# Patient Record
Sex: Female | Born: 1965
Health system: Southern US, Community
[De-identification: ages and names within clinical notes are randomized; demographics above are authoritative.]

## PROBLEM LIST (undated history)

## (undated) DIAGNOSIS — E785 Hyperlipidemia, unspecified: Secondary | ICD-10-CM

## (undated) DIAGNOSIS — T7840XA Allergy, unspecified, initial encounter: Secondary | ICD-10-CM

## (undated) DIAGNOSIS — Z889 Allergy status to unspecified drugs, medicaments and biological substances status: Secondary | ICD-10-CM

## (undated) HISTORY — DX: Allergy status to unspecified drugs, medicaments and biological substances: Z88.9

## (undated) HISTORY — PX: OTHER SURGICAL HISTORY: SHX169

## (undated) HISTORY — DX: Hyperlipidemia, unspecified: E78.5

## (undated) HISTORY — DX: Allergy, unspecified, initial encounter: T78.40XA

---

## 2008-04-05 ENCOUNTER — Ambulatory Visit (HOSPITAL_COMMUNITY): Admission: RE | Admit: 2008-04-05 | Discharge: 2008-04-05 | Payer: Self-pay | Admitting: Occupational Medicine

## 2008-07-01 ENCOUNTER — Telehealth (INDEPENDENT_AMBULATORY_CARE_PROVIDER_SITE_OTHER): Payer: Self-pay | Admitting: *Deleted

## 2008-07-08 ENCOUNTER — Ambulatory Visit: Payer: Self-pay | Admitting: Internal Medicine

## 2008-07-08 LAB — CONVERTED CEMR LAB
Albumin: 4 g/dL (ref 3.5–5.2)
BUN: 13 mg/dL (ref 6–23)
Basophils Absolute: 0 10*3/uL (ref 0.0–0.1)
Chloride: 103 meq/L (ref 96–112)
Cholesterol: 275 mg/dL — ABNORMAL HIGH (ref 0–200)
Eosinophils Absolute: 0.2 10*3/uL (ref 0.0–0.7)
GFR calc non Af Amer: 115.9 mL/min (ref >60)
HCT: 35.2 % — ABNORMAL LOW (ref 36.0–46.0)
Hemoglobin: 12 g/dL (ref 12.0–15.0)
Leukocytes, UA: NEGATIVE
Lymphocytes Relative: 36.6 % (ref 12.0–46.0)
Lymphs Abs: 1.7 10*3/uL (ref 0.7–4.0)
MCHC: 34 g/dL (ref 30.0–36.0)
Neutro Abs: 2.3 10*3/uL (ref 1.4–7.7)
Nitrite: NEGATIVE
Platelets: 295 10*3/uL (ref 150.0–400.0)
Potassium: 3.8 meq/L (ref 3.5–5.1)
RDW: 12.7 % (ref 11.5–14.6)
Specific Gravity, Urine: 1.015 (ref 1.000–1.030)
TSH: 0.74 microintl units/mL (ref 0.35–5.50)
Total Bilirubin: 0.8 mg/dL (ref 0.3–1.2)
Total CHOL/HDL Ratio: 5
Triglycerides: 65 mg/dL (ref 0.0–149.0)
Urobilinogen, UA: 0.2 (ref 0.0–1.0)
VLDL: 13 mg/dL (ref 0.0–40.0)
pH: 6.5 (ref 5.0–8.0)

## 2008-07-15 ENCOUNTER — Ambulatory Visit: Payer: Self-pay | Admitting: Internal Medicine

## 2008-07-15 ENCOUNTER — Encounter: Payer: Self-pay | Admitting: Internal Medicine

## 2008-07-15 ENCOUNTER — Other Ambulatory Visit: Admission: RE | Admit: 2008-07-15 | Discharge: 2008-07-15 | Payer: Self-pay | Admitting: Internal Medicine

## 2008-07-15 DIAGNOSIS — A159 Respiratory tuberculosis unspecified: Secondary | ICD-10-CM | POA: Insufficient documentation

## 2008-07-15 DIAGNOSIS — J301 Allergic rhinitis due to pollen: Secondary | ICD-10-CM

## 2008-07-15 DIAGNOSIS — E785 Hyperlipidemia, unspecified: Secondary | ICD-10-CM

## 2008-07-15 DIAGNOSIS — N760 Acute vaginitis: Secondary | ICD-10-CM | POA: Insufficient documentation

## 2008-07-15 DIAGNOSIS — H9319 Tinnitus, unspecified ear: Secondary | ICD-10-CM | POA: Insufficient documentation

## 2008-07-25 ENCOUNTER — Encounter: Payer: Self-pay | Admitting: Internal Medicine

## 2009-01-10 ENCOUNTER — Ambulatory Visit: Payer: Self-pay | Admitting: Internal Medicine

## 2009-01-10 LAB — CONVERTED CEMR LAB
Direct LDL: 210.8 mg/dL
Total CHOL/HDL Ratio: 5
VLDL: 11.6 mg/dL (ref 0.0–40.0)

## 2009-01-13 ENCOUNTER — Telehealth: Payer: Self-pay | Admitting: Internal Medicine

## 2009-02-03 ENCOUNTER — Ambulatory Visit: Payer: Self-pay | Admitting: Internal Medicine

## 2009-03-22 ENCOUNTER — Ambulatory Visit: Payer: Self-pay | Admitting: Internal Medicine

## 2009-03-22 LAB — CONVERTED CEMR LAB
ALT: 18 units/L (ref 0–35)
AST: 19 units/L (ref 0–37)
Albumin: 3.9 g/dL (ref 3.5–5.2)
Alkaline Phosphatase: 50 units/L (ref 39–117)
Cholesterol: 181 mg/dL (ref 0–200)
Total CHOL/HDL Ratio: 3
Triglycerides: 40 mg/dL (ref 0.0–149.0)

## 2009-03-29 ENCOUNTER — Encounter: Payer: Self-pay | Admitting: Internal Medicine

## 2009-11-27 ENCOUNTER — Encounter: Payer: Self-pay | Admitting: Internal Medicine

## 2010-01-02 ENCOUNTER — Telehealth: Payer: Self-pay | Admitting: Internal Medicine

## 2010-01-06 ENCOUNTER — Ambulatory Visit: Payer: Self-pay | Admitting: Internal Medicine

## 2010-01-06 LAB — CONVERTED CEMR LAB
AST: 21 units/L (ref 0–37)
Albumin: 4.3 g/dL (ref 3.5–5.2)

## 2010-01-08 ENCOUNTER — Encounter: Payer: Self-pay | Admitting: Internal Medicine

## 2010-03-01 NOTE — Progress Notes (Signed)
Summary: LABS  Phone Note Call from Patient Call back at [phone]   Summary of Call: Pt left vm that she has office visit 01/06/10. She wants to know if she needs labs prior?  Initial call taken by: Lamar Sprinkles, CMA,  January 02, 2010 5:22 PM  Follow-up for Phone Call        had lipid and metabolic panel 03/22/09 and these were normal. No need for any screening lab. Follow-up by: Jacques Navy MD,  January 02, 2010 5:32 PM  Additional Follow-up for Phone Call Additional follow up Details #1::        Left vm for pt that nothing was needed prior to apt.  Additional Follow-up by: Lamar Sprinkles, CMA,  January 02, 2010 5:40 PM

## 2010-03-01 NOTE — Letter (Signed)
   [facility] [address] [place], [place]  [address] Phone: [phone]      March 29, 2009   Lindsey Boyd [address] [place], [place] [address]  RE:  LAB RESULTS  Dear  Ms. HORSEY,  The following is an interpretation of your most recent lab tests.  Please take note of any instructions provided or changes to medications that have resulted from your lab work.  LIVER FUNCTION TESTS:  Good - no changes needed  LIPID PANEL:  Good - no changes needed Triglyceride: 40.0   Cholesterol: 181   LDL: 107   HDL: 66.00   Chol/HDL%:  3    Excellent response with a drop in LDL from 210 to 107. Please continue medication.    Sincerely Yours,    Jacques Navy MD

## 2010-03-01 NOTE — Assessment & Plan Note (Signed)
Summary: PH NOTE APPT -D/T----STC--pt rs'd/cd   Vital Signs:  Patient profile:   45 year old female Height:      60 inches Weight:      108 pounds BMI:     21.17 O2 Sat:      97 % on Room air Temp:     97.5 degrees F oral Pulse rate:   74 / minute BP sitting:   104 / 68  (left arm) Cuff size:   regular  Vitals Entered By: Bill Salinas CMA (February 03, 2009 3:46 PM)  O2 Flow:  Room air CC: pt here for office visit to discuss high lipid levels, appt to discuss medical therapy/ ab   Primary Care Provider:  [facility]  CC:  pt here for office visit to discuss high lipid levels and appt to discuss medical therapy/ ab.  History of Present Illness: patient presents to review lab: LDL 210!! HDL is good.  Current Medications (verified): 1)  None  Allergies (verified): No Known Drug Allergies PMH-FH-SH reviewed-no changes except otherwise noted  Review of Systems  The patient denies anorexia, fever, weight loss, weight gain, vision loss, decreased hearing, hoarseness, chest pain, syncope, dyspnea on exertion, peripheral edema, prolonged cough, headaches, hemoptysis, abdominal pain, melena, hematochezia, severe indigestion/heartburn, hematuria, incontinence, genital sores, muscle weakness, suspicious skin lesions, transient blindness, difficulty walking, depression, unusual weight change, abnormal bleeding, enlarged lymph nodes, angioedema, and breast masses.    Physical Exam  General:  Well-developed,well-nourished,in no acute distress; alert,appropriate and cooperative throughout examination Head:  Normocephalic and atraumatic without obvious abnormalities. No apparent alopecia or balding. Eyes:  corneas and lenses clear and no injection.   Lungs:  normal respiratory effort.   Heart:  normal rate and regular rhythm.   Neurologic:  alert & oriented X3 and gait normal.   Skin:  turgor normal and color normal.   Psych:  Oriented X3, memory intact for recent and remote, normally  interactive, and good eye contact.     Impression & Recommendations:  Problem # 1:  OTHER AND UNSPECIFIED HYPERLIPIDEMIA (ICD-272.4) Extremely high LDL despite life-style mgt. Discussed metabolic derrangment and need for life-time therapy. Discussed medication: mechanism of action and side effects.   Plan - start simvastatin 20mg  once daily          f/u lab in 1 month.  Her updated medication list for this problem includes:    Simvastatin 20 Mg Tabs (Simvastatin) .Marland Kitchen... 1 by mouth once daily for cholesterol management  (20 min face-to-face counseling and education)  Complete Medication List: 1)  Simvastatin 20 Mg Tabs (Simvastatin) .Marland Kitchen.. 1 by mouth once daily for cholesterol management 2)  Cromolyn Sodium 4 % Soln (Cromolyn sodium) .... 2 qtts od 4 times a day until irritation resolves. (allergic conjunctivitis) Prescriptions: CROMOLYN SODIUM 4 % SOLN (CROMOLYN SODIUM) 2 qtts OD 4 times a day until irritation resolves. (allergic conjunctivitis)  #10cc x 0   Entered and Authorized by:   Jacques Navy MD   Signed by:   Jacques Navy MD on 02/04/2009   Method used:   Electronically to        [facility] [phone]* (retail)       [address]       [place], [place]  [address]       Ph: [phone] or [phone]       Fax: [phone]   RxID:   [id number] SIMVASTATIN 20 MG TABS (SIMVASTATIN) 1 by mouth once daily  for cholesterol management  #30 x 12   Entered and Authorized by:   Jacques Navy MD   Signed by:   Jacques Navy MD on 02/04/2009   Method used:   Electronically to        [facility] [phone]* (retail)       [address]       [place], [place]  [address]       Ph: [phone] or [phone]       Fax: [phone]   RxID:   [id number]

## 2010-03-02 NOTE — Letter (Signed)
   [facility] [address] [place], [place]  [address] Phone: [phone]      January 09, 2010   GENOVEVA Henningsen [address] [place], [place] [address]  RE:  LAB RESULTS  Dear  Ms. WIATREK,  The following is an interpretation of your most recent lab tests.  Please take note of any instructions provided or changes to medications that have resulted from your lab work.  LIVER FUNCTION TESTS:  Good - no changes needed    Thanks for sending health screening labs - looking good. Liver functions are normal.  Plan - continue present medications.   Sincerely Yours,    Jacques Navy MD  Patient: Lindsey Boyd Note: All result statuses are Final unless otherwise noted.  Tests: (1) Hepatic/Liver Function Panel (HEPATIC)   Total Bilirubin           0.7 mg/dL                   2.9-5.6   Direct Bilirubin          0.1 mg/dL                   2.1-3.0   Alkaline Phosphatase      51 U/L                      39-117   AST                       21 U/L                      0-37   ALT                       17 U/L                      0-35   Total Protein             7.6 g/dL                    8.6-5.7   Albumin                   4.3 g/dL                    8.4-6.9

## 2010-03-02 NOTE — Assessment & Plan Note (Signed)
Summary: OV---STC   Vital Signs:  Patient profile:   45 year old female Height:      60 inches Weight:      112 pounds BMI:     21.95 O2 Sat:      98 % on Room air Temp:     97.4 degrees F oral Pulse rate:   73 / minute BP sitting:   98 / 72  (left arm) Cuff size:   regular  Vitals Entered By: Bill Salinas CMA (January 06, 2010 10:45 AM)  O2 Flow:  Room air  Primary Care Illiana Losurdo:  [facility]   History of Present Illness: Patient returns for follow-up of hyperlipidemia. She has restarted statin therapy. She did have lipid profile as part of [facility] insurance screening and will obtain results. Last lab in Feb '11 with good results.. She feels well and has no medication side effects.  Current Medications (verified): 1)  Simvastatin 20 Mg Tabs (Simvastatin) .Marland Kitchen.. 1 By Mouth Once Daily For Cholesterol Management  Allergies (verified): No Known Drug Allergies PMH-FH-SH reviewed-no changes except otherwise noted  Review of Systems  The patient denies anorexia, fever, weight loss, chest pain, dyspnea on exertion, abdominal pain, muscle weakness, difficulty walking, and enlarged lymph nodes.    Physical Exam  General:  Well-developed,well-nourished,in no acute distress; alert,appropriate and cooperative throughout examination Eyes:  C&S clear Lungs:  normal respiratory effort.   Heart:  normal rate and regular rhythm.     Impression & Recommendations:  Problem # 1:  OTHER AND UNSPECIFIED HYPERLIPIDEMIA (ICD-272.4) Will obtain health screening labs. Will send for hepatic profile.  Her updated medication list for this problem includes:    Simvastatin 20 Mg Tabs (Simvastatin) .Marland Kitchen... 1 by mouth once daily for cholesterol management  Orders: TLB-Hepatic/Liver Function Pnl (80076-HEPATIC)  addendum liver functions normal. Lipid from screening normal range.  Problem # 2:  ALLERGIC RHINITIS, SEASONAL (ICD-477.0) provided refill Rxs.  Complete Medication List: 1)  Simvastatin 20 Mg  Tabs (Simvastatin) .Marland Kitchen.. 1 by mouth once daily for cholesterol management 2)  Cromolyn Sodium 4 % Soln (Cromolyn sodium) .... 2 qttds ou qid 3)  Nasonex 50 Mcg/act Susp (Mometasone furoate) .... 2 sprays per nostril once daily Prescriptions: SIMVASTATIN 20 MG TABS (SIMVASTATIN) 1 by mouth once daily for cholesterol management  #30 x 12   Entered and Authorized by:   Jacques Navy MD   Signed by:   Jacques Navy MD on 01/06/2010   Method used:   Print then Give to Patient   RxID:   [id number] NASONEX 50 MCG/ACT SUSP (MOMETASONE FUROATE) 2 sprays per nostril once daily  #1 bottle x 5   Entered and Authorized by:   Jacques Navy MD   Signed by:   Jacques Navy MD on 01/06/2010   Method used:   Print then Give to Patient   RxID:   [id number] CROMOLYN SODIUM 4 % SOLN (CROMOLYN SODIUM) 2 qttds ou qid  #10 cc x 3   Entered and Authorized by:   Jacques Navy MD   Signed by:   Jacques Navy MD on 01/06/2010   Method used:   Print then Give to Patient   RxID:   [id number]    Orders Added: 1)  TLB-Hepatic/Liver Function Pnl [80076-HEPATIC] 2)  Est. Patient Level II [03474]

## 2010-03-28 IMAGING — CR DG CHEST 2V
2 series · 2 of 2 positions shown · non-contrast
Comparison: None

CLINICAL DATA: Positive PPD.  Asymptomatic.

CHEST - 2 VIEW

[w chest pa]
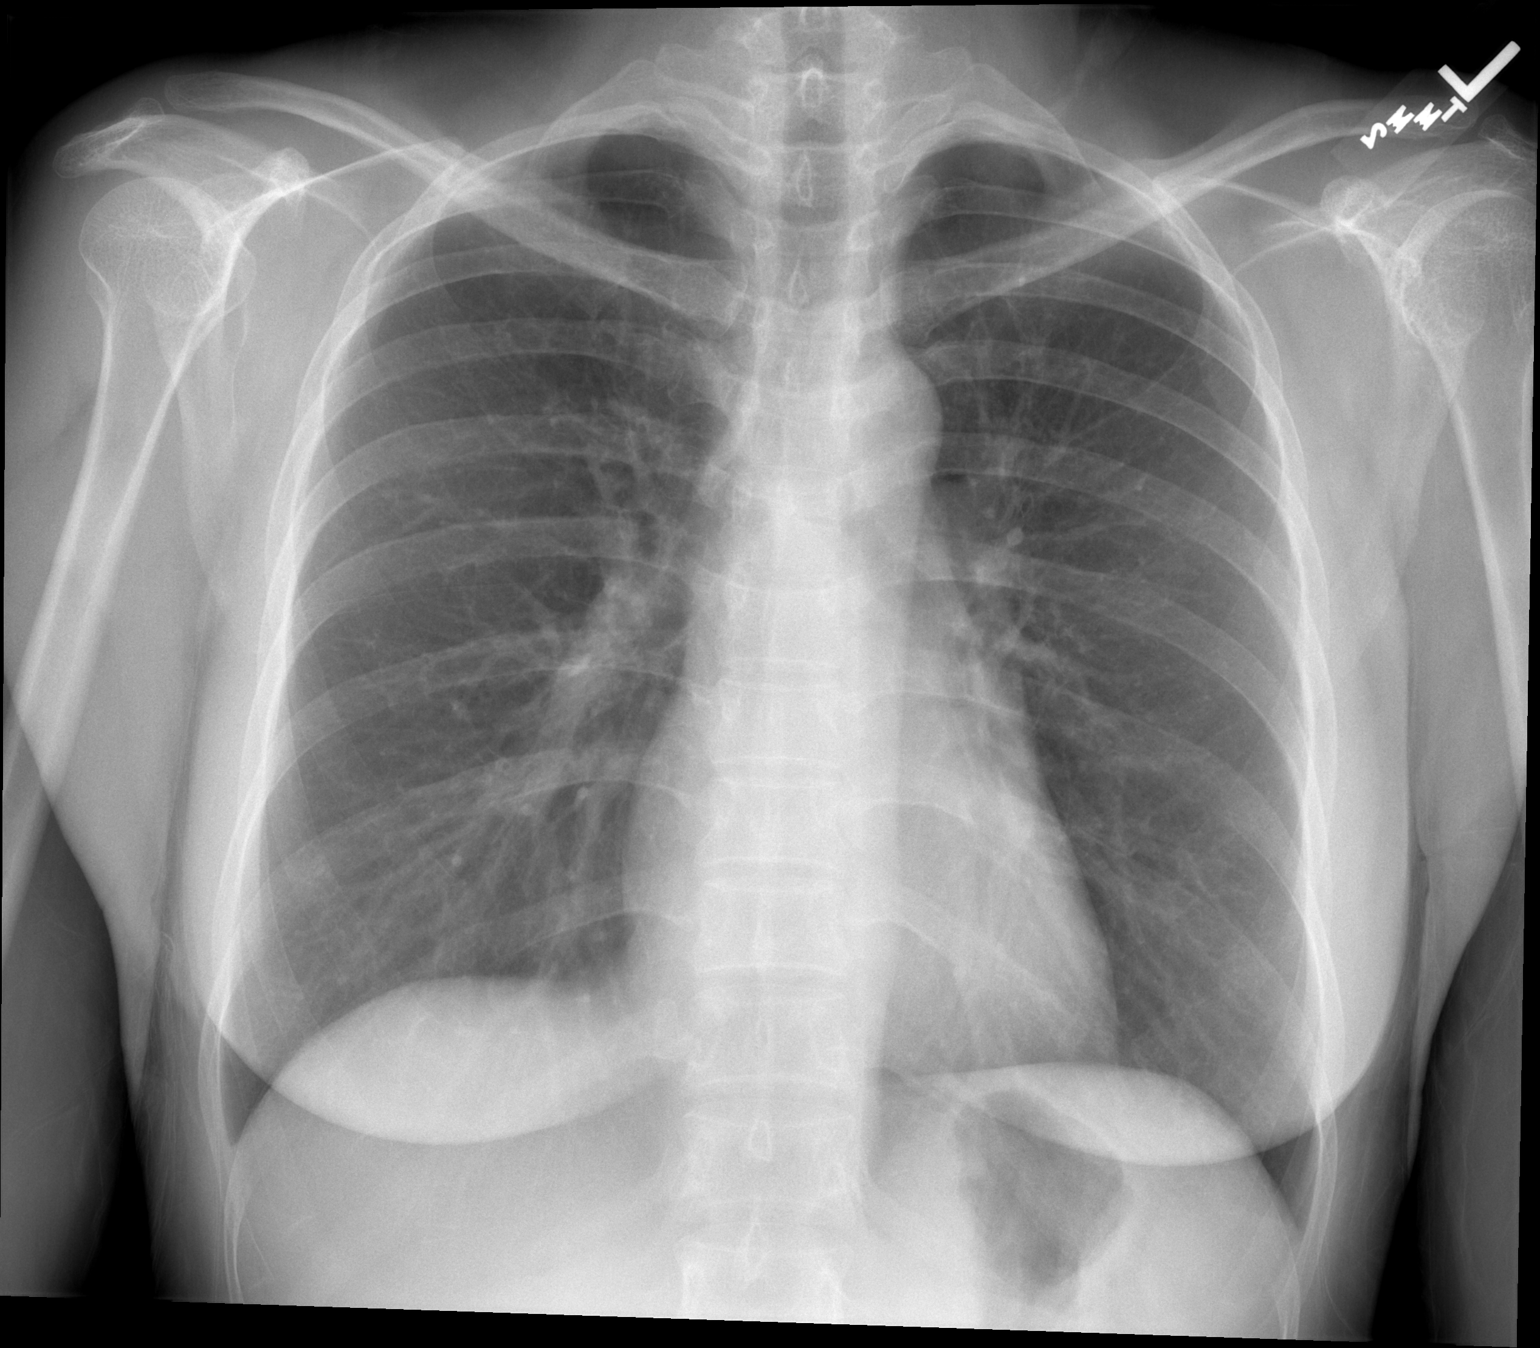

[w chest lat]
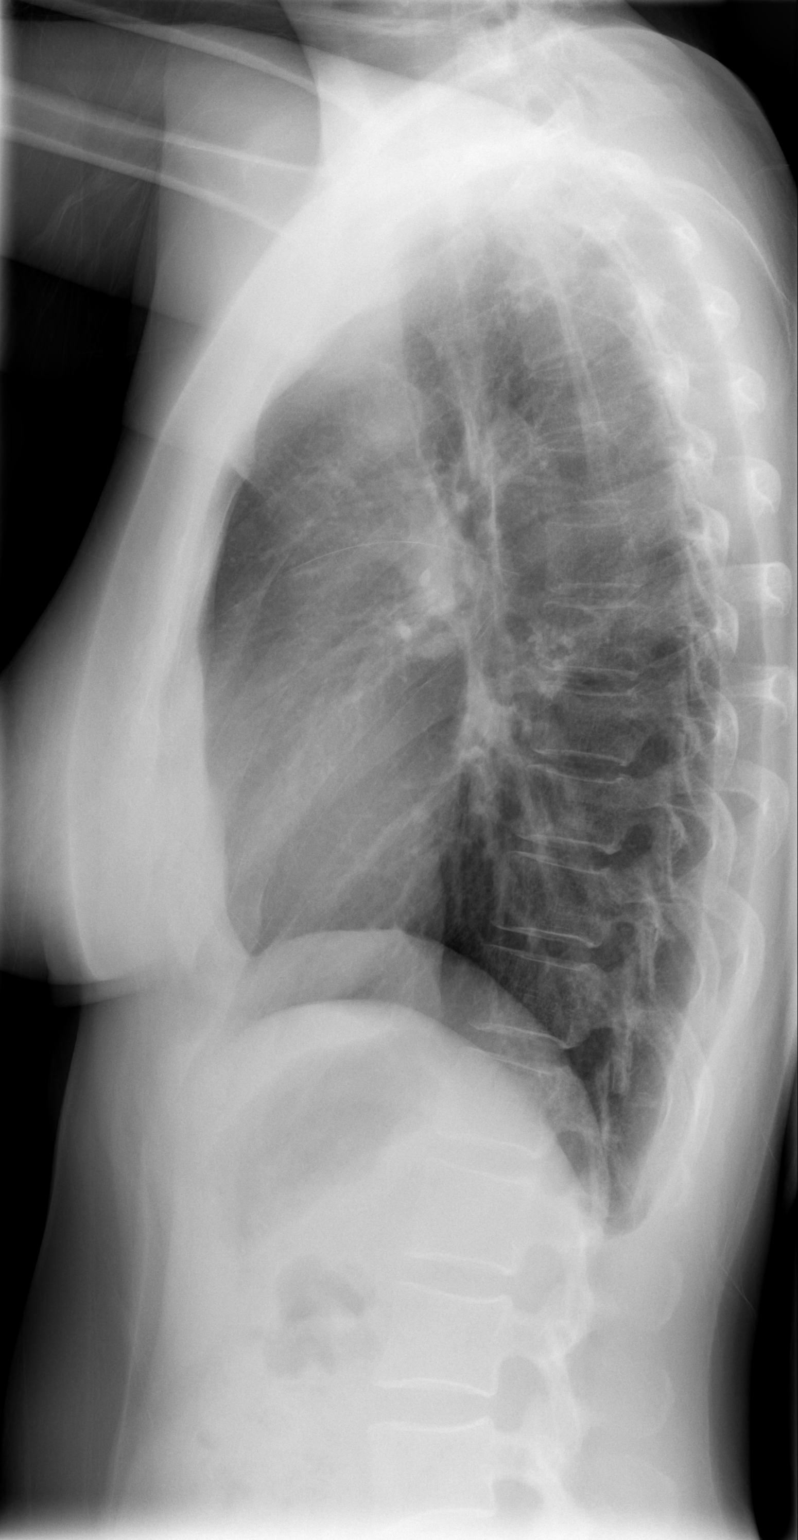

[2 of 2 positions shown; findings below may reference images not displayed]

FINDINGS: Heart size is normal.  The mediastinum is unremarkable.
No effusions.  I think there may be wonder to small calcified
granulomas in the superior segment of the left lower lobe.  No sign
of active infection.  No sign of mediastinal nodes.  Bony
structures unremarkable.
IMPRESSION: Suspicion of a few small granulomas in the superior segment of the
left lower lobe.  No sign of active pulmonary infection.

## 2011-01-09 ENCOUNTER — Other Ambulatory Visit: Payer: Self-pay | Admitting: Internal Medicine

## 2011-04-10 ENCOUNTER — Encounter: Payer: Self-pay | Admitting: Internal Medicine

## 2011-04-10 ENCOUNTER — Ambulatory Visit (INDEPENDENT_AMBULATORY_CARE_PROVIDER_SITE_OTHER): Payer: 59 | Admitting: Internal Medicine

## 2011-04-10 ENCOUNTER — Other Ambulatory Visit (INDEPENDENT_AMBULATORY_CARE_PROVIDER_SITE_OTHER): Payer: 59

## 2011-04-10 VITALS — BP 96/64 | HR 93 | Temp 98.0°F | Resp 14 | Wt 113.1 lb

## 2011-04-10 DIAGNOSIS — E785 Hyperlipidemia, unspecified: Secondary | ICD-10-CM

## 2011-04-10 DIAGNOSIS — Z23 Encounter for immunization: Secondary | ICD-10-CM

## 2011-04-10 DIAGNOSIS — H9319 Tinnitus, unspecified ear: Secondary | ICD-10-CM

## 2011-04-10 LAB — HEPATIC FUNCTION PANEL
AST: 23 U/L (ref 0–37)
Albumin: 4.3 g/dL (ref 3.5–5.2)
Alkaline Phosphatase: 53 U/L (ref 39–117)
Total Protein: 7.7 g/dL (ref 6.0–8.3)

## 2011-04-10 LAB — LIPID PANEL
Cholesterol: 227 mg/dL — ABNORMAL HIGH (ref 0–200)
VLDL: 14 mg/dL (ref 0.0–40.0)

## 2011-04-10 NOTE — Progress Notes (Signed)
  Subjective:    Patient ID: Lindsey Boyd, female    DOB: 11/21/1965, 46 y.o.   MRN: [id number]  HPI Ms. Colarusso presents for evaluation of change in tinnitus: she hears her heart beat at the left ear at night. She is concerned for carotid disease or brain tumor. She has had no fever, chills or other systemic symptoms. She has no hearing loss, no ear pain. She has no cognitive or mental status changes, no change in vision, no paresthesia, change in coordination or other CNS symptoms.  PMH, FamHx and SocHx reviewed for any changes and relevance.    Review of Systems .mnroxb     Objective:   Physical Exam Filed Vitals:   04/10/11 1036  BP: 96/64  Pulse: 93  Temp: 98 F (36.7 C)  Resp: 14   Wt Readings from Last 3 Encounters:  04/10/11 113 lb 2 oz (51.313 kg)  01/06/10 112 lb (50.803 kg)  02/03/09 108 lb (48.988 kg)   Gen'l- WNWD woman in no distress. HEENT - EACs clear, left TM with question of old, healed perforation anterior superior quadrant, normal movement left TM with insuffulation. Cor- RRR, no carotid bruit, no murmur rub or gallop Pulm - normal respirations Neuro - A&O x 3, CN II-XII normal - hearing is normal with tuning for test (Rhinne), normal bone conduction (Weber), cerebellar function normal - gait, movement.  Lab Results  Component Value Date   CHOL 227* 04/10/2011   HDL 70.60 04/10/2011   LDLCALC 107* 03/22/2009   LDLDIRECT 143.4 04/10/2011   TRIG 70.0 04/10/2011   CHOLHDL 3 04/10/2011         Assessment & Plan:

## 2011-04-11 NOTE — Assessment & Plan Note (Signed)
LDL less well controlled than previously and above goal of 130 or less.   Plan - maximize dietary management. If no room for improvement - will increase zocor to 40 mg or change to vytorin 20/10

## 2011-04-11 NOTE — Assessment & Plan Note (Signed)
Normal exam and normal hearing. No evidence of congestion/eustachian tube dysfunction. Her awareness of her heartbeat left ear may be a tinnitus variant.  Plan- patient reassured that there is no sign of CNS disease or tumor or carotid disease           Use of white noise generator for audio-distraction.

## 2011-04-13 ENCOUNTER — Encounter: Payer: Self-pay | Admitting: Internal Medicine

## 2011-05-25 ENCOUNTER — Other Ambulatory Visit: Payer: Self-pay | Admitting: Internal Medicine

## 2011-05-25 ENCOUNTER — Other Ambulatory Visit: Payer: Self-pay

## 2011-05-25 MED ORDER — MOMETASONE FUROATE 50 MCG/ACT NA SUSP: 2.0000 | Freq: Every day | NASAL | Status: DC

## 2011-07-18 ENCOUNTER — Other Ambulatory Visit: Payer: Self-pay | Admitting: Internal Medicine

## 2011-12-05 ENCOUNTER — Other Ambulatory Visit: Payer: Self-pay | Admitting: Internal Medicine

## 2012-03-15 ENCOUNTER — Other Ambulatory Visit: Payer: Self-pay

## 2012-08-26 ENCOUNTER — Other Ambulatory Visit: Payer: Self-pay | Admitting: Internal Medicine

## 2012-10-21 ENCOUNTER — Ambulatory Visit (INDEPENDENT_AMBULATORY_CARE_PROVIDER_SITE_OTHER): Payer: 59 | Admitting: Internal Medicine

## 2012-10-21 ENCOUNTER — Other Ambulatory Visit (INDEPENDENT_AMBULATORY_CARE_PROVIDER_SITE_OTHER): Payer: 59

## 2012-10-21 ENCOUNTER — Encounter: Payer: Self-pay | Admitting: Internal Medicine

## 2012-10-21 VITALS — BP 100/62 | HR 60 | Temp 96.7°F | Wt 112.4 lb

## 2012-10-21 DIAGNOSIS — L299 Pruritus, unspecified: Secondary | ICD-10-CM

## 2012-10-21 DIAGNOSIS — E785 Hyperlipidemia, unspecified: Secondary | ICD-10-CM

## 2012-10-21 LAB — COMPREHENSIVE METABOLIC PANEL
ALT: 15 U/L (ref 0–35)
AST: 18 U/L (ref 0–37)
Albumin: 4.2 g/dL (ref 3.5–5.2)
Alkaline Phosphatase: 58 U/L (ref 39–117)
Potassium: 5.1 mEq/L (ref 3.5–5.1)
Sodium: 138 mEq/L (ref 135–145)
Total Protein: 7.8 g/dL (ref 6.0–8.3)

## 2012-10-21 LAB — CBC WITH DIFFERENTIAL/PLATELET
Eosinophils Relative: 3.1 % (ref 0.0–5.0)
HCT: 37.9 % (ref 36.0–46.0)
Hemoglobin: 13.1 g/dL (ref 12.0–15.0)
Lymphs Abs: 2.1 10*3/uL (ref 0.7–4.0)
Monocytes Relative: 7.5 % (ref 3.0–12.0)
Neutro Abs: 4.4 10*3/uL (ref 1.4–7.7)
WBC: 7.3 10*3/uL (ref 4.5–10.5)

## 2012-10-21 LAB — LIPID PANEL: Total CHOL/HDL Ratio: 4

## 2012-10-21 LAB — HEPATIC FUNCTION PANEL
Albumin: 4.2 g/dL (ref 3.5–5.2)
Bilirubin, Direct: 0.1 mg/dL (ref 0.0–0.3)
Total Protein: 7.8 g/dL (ref 6.0–8.3)

## 2012-10-21 LAB — T4, FREE: Free T4: 1.12 ng/dL (ref 0.60–1.60)

## 2012-10-21 MED ORDER — SIMVASTATIN 40 MG PO TABS: ORAL_TABLET | ORAL | Status: DC

## 2012-10-21 NOTE — Progress Notes (Signed)
Subjective:    Patient ID: Lindsey Boyd, female    DOB: 03/25/1965, 47 y.o.   MRN: [id number]  HPI Ms. Conkle presents for evaluation of pruritus arms and legs for several weeks. She denies any fever, chills, new contact materials (soaps, etc), no lymphadenopathy, no weight loss, no night sweats. She has not noticed any skin lesion. The itching is worse at night.  Past Medical History  Diagnosis Date  . Allergy   . Hyperlipidemia    History reviewed. No pertinent past surgical history. Family History  Problem Relation Age of Onset  . Diabetes Mother   . Hyperlipidemia Mother   . Hypertension Mother   . Osteoporosis Mother   . Cancer Maternal Grandmother     breast  . Cancer Maternal Grandfather     liver   History   Social History  . Marital Status: Married    Spouse Name: N/A    Number of Children: N/A  . Years of Education: N/A   Occupational History  . Not on file.   Social History Main Topics  . Smoking status: Never Smoker   . Smokeless tobacco: Not on file  . Alcohol Use: No  . Drug Use: No  . Sexual Activity: Not on file   Other Topics Concern  . 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 cromolyn (OPTICROM) 4 % ophthalmic solution INSTILL 2 DROPS IN BOTH EYES FOUR TIMES DAILY  10 mL  5  . NASONEX 50 MCG/ACT nasal spray PLACE 2 SPRAYS INTO THE NOSE DAILY.  17 g  5  . simvastatin (ZOCOR) 20 MG tablet TAKE 1 TABLET BY MOUTH DAILY FOR CHOLESTEROL MANAGEMENT  30 tablet  4   No current facility-administered medications on file prior to visit.      Review of Systems System review is negative for any constitutional, cardiac, pulmonary, GI or neuro symptoms or complaints  other than as described in the HPI.     Objective:   Physical Exam Filed Vitals:   10/21/12 1108  BP: 100/62  Pulse: 60  Temp: 96.7 F (35.9 C)   BP Readings from Last 3 Encounters:  10/21/12 100/62  04/10/11 96/64  01/06/10 98/72   Wt Readings from Last 3 Encounters:  10/21/12 112 lb 6.4 oz (50.984 kg)  04/10/11 113 lb 2 oz (51.313 kg)  01/06/10 112 lb (50.803 kg)   Gen'l- WNWD woman in no distress HEENT- Sclera w/o icterus, PERRLA Neck - supple, no thyromegaly Nodes - negative submandibular, cervical, axillary, inguinal regions Cor 2+ radial, RRR, no M/R.G Pulm - normal respirations Abd - BS+, no HSM, no guarding or tenderness.       Assessment & Plan:  Pruritus - normal physical exam including normal skin, normal abdomen, absence of lymph nodes and normal thyroid gland. Need to look for underlying problems with a series of labs as noted on To Do list.   Plan labwork - results will be posted to MyChart  For itching - Claritin 10 mg twice a day; Ranitidine 150 mg twice a day; soothing tepid baths with colloids - Aveeno or baking soda.  If all labs  are normal and this continues will consider referral to dermatologist.

## 2012-10-21 NOTE — Patient Instructions (Addendum)
Pruritus - normal physical exam including normal skin, normal abdomen, absence of lymph nodes and normal thyroid gland. Need to look for underlying problems with a series of labs as noted on To Do list.   Plan labwork - results will be posted to MyChart  For itching - Claritin 10 mg twice a day; Ranitidine 150 mg twice a day; soothing tepid baths with colloids - Aveeno or baking soda.  If all labs are normal and this continues will consider referral to dermatologist.    Pruritus  Pruritis is an itch. There are many different problems that can cause an itch. Dry skin is one of the most common causes of itching. Most cases of itching do not require medical attention.  HOME CARE INSTRUCTIONS  Make sure your skin is moistened on a regular basis. A moisturizer that contains petroleum jelly is best for keeping moisture in your skin. If you develop a rash, you may try the following for relief:   Use corticosteroid cream.  Apply cool compresses to the affected areas.  Bathe with Epsom salts or baking soda in the bathwater.  Soak in colloidal oatmeal baths. These are available at your pharmacy.  Apply baking soda paste to the rash. Stir water into baking soda until it reaches a paste-like consistency.  Use an anti-itch lotion.  Take over-the-counter diphenhydramine medicine by mouth as the instructions direct.  Avoid scratching. Scratching may cause the rash to become infected. If itching is very bad, your caregiver may suggest prescription lotions or creams to lessen your symptoms.  Avoid hot showers, which can make itching worse. A cold shower may help with itching as long as you use a moisturizer after the shower. SEEK MEDICAL CARE IF: The itching does not go away after several days. Document Released: 09/27/2010 Document Revised: 04/09/2011 Document Reviewed: 09/27/2010 [facility] Patient Information 2014 [place], [place].

## 2012-12-04 ENCOUNTER — Other Ambulatory Visit: Payer: Self-pay

## 2013-01-05 ENCOUNTER — Encounter: Payer: Self-pay | Admitting: Internal Medicine

## 2013-01-05 ENCOUNTER — Other Ambulatory Visit (INDEPENDENT_AMBULATORY_CARE_PROVIDER_SITE_OTHER): Payer: 59

## 2013-01-05 ENCOUNTER — Ambulatory Visit (INDEPENDENT_AMBULATORY_CARE_PROVIDER_SITE_OTHER): Payer: 59 | Admitting: Internal Medicine

## 2013-01-05 VITALS — BP 92/68 | HR 59 | Temp 98.5°F | Ht 60.0 in | Wt 105.8 lb

## 2013-01-05 DIAGNOSIS — J301 Allergic rhinitis due to pollen: Secondary | ICD-10-CM

## 2013-01-05 DIAGNOSIS — Z0000 Encounter for general adult medical examination without abnormal findings: Secondary | ICD-10-CM

## 2013-01-05 DIAGNOSIS — E785 Hyperlipidemia, unspecified: Secondary | ICD-10-CM

## 2013-01-05 LAB — LIPID PANEL
Cholesterol: 188 mg/dL (ref 0–200)
LDL Cholesterol: 106 mg/dL — ABNORMAL HIGH (ref 0–99)
Triglycerides: 78 mg/dL (ref 0.0–149.0)
VLDL: 15.6 mg/dL (ref 0.0–40.0)

## 2013-01-05 NOTE — Progress Notes (Signed)
Subjective:    Patient ID: Lindsey Boyd, female    DOB: [birth year], 47 y.o.   MRN: [id number]  HPI Lindsey Boyd presents for an annual wellness exam. She has been doing well. Her mother has had a devastating stroke and is currently at home requiring full time care. She is now very health conscious. She does report intermittent tingling of the scalp/head at the left parietal region.   Last gyn/PAP smear was 4 years ago. She has started to have mammograms - done at work but no report in [facility]. Last eye exam 2 - years, no loss or change in vision. She has had black dot scotoma.  Past Medical History  Diagnosis Date  . Allergy   . Hyperlipidemia    History reviewed. No pertinent past surgical history. Family History  Problem Relation Age of Onset  . Diabetes Mother   . Hyperlipidemia Mother   . Hypertension Mother   . Osteoporosis Mother   . Cancer Maternal Grandmother     breast  . Cancer Maternal Grandfather     liver   History   Social History  . Marital Status: Married    Spouse Name: N/A    Number of Children: N/A  . Years of Education: N/A   Occupational History  . Not on file.   Social History Main Topics  . Smoking status: Never Smoker   . Smokeless tobacco: Not on file  . Alcohol Use: No  . Drug Use: No  . Sexual Activity: Not on file   Other Topics Concern  . 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 cromolyn (OPTICROM) 4 % ophthalmic solution INSTILL 2 DROPS IN BOTH EYES FOUR TIMES DAILY  10 mL  5  . NASONEX 50 MCG/ACT nasal spray PLACE 2 SPRAYS INTO THE NOSE DAILY.  17 g  5   No current facility-administered medications on file prior to visit.        Review of  Systems Constitutional:  Negative for fever, chills, activity change and unexpected weight change.  HEENT:  Negative for hearing loss, ear pain, congestion, neck stiffness. Minor allergy with postnasal drip. Negative for sore throat or swallowing problems. Negative for dental complaints.   Eyes: Negative for vision loss or change in visual acuity.  Respiratory: Negative for chest tightness and wheezing. Negative for DOE.   Cardiovascular: Negative for chest pain or palpitations. No decreased exercise tolerance Gastrointestinal: No change in bowel habit. No bloating or gas. No reflux or indigestion Genitourinary: Negative for urgency, frequency, flank pain and difficulty urinating.  Musculoskeletal: Negative for myalgias, back pain, arthralgias and gait problem.  Neurological: Negative for dizziness, tremors, weakness and headaches.  Hematological: Negative for adenopathy.  Psychiatric/Behavioral: Negative for behavioral problems and dysphoric mood.       Objective:   Physical Exam Filed Vitals:   01/05/13 0854  BP: 92/68  Pulse: 59  Temp: 98.5 F (36.9 C)   Wt Readings from Last 3 Encounters:  01/05/13 105 lb 12.8 oz (47.991 kg)  10/21/12 112 lb 6.4 oz (50.984 kg)  04/10/11 113 lb 2 oz (51.313 kg)   Gen'l: well  nourished, well developed Woman in no distress HEENT - /AT, EACs/TMs normal, oropharynx with native dentition in good condition, no buccal or palatal lesions, posterior pharynx clear, mucous membranes moist. C&S clear, PERRLA, fundi - normal Neck - supple, no thyromegaly Nodes- negative submental, cervical, supraclavicular regions Chest - no deformity, no CVAT Lungs - clear without rales, wheezes. No increased work of breathing Breast - deferred to gyn Cardiovascular - regular rate and rhythm, quiet precordium, no murmurs, rubs or gallops, 2+ radial, DP and PT pulses Abdomen - BS+ x 4, no HSM, no guarding or rebound or tenderness Pelvic - deferred to gyn Rectal -  deferred to gyn Extremities - no clubbing, cyanosis, edema or deformity.  Neuro - A&O x 3, CN II-XII normal, motor strength normal and equal, DTRs 2+ and symmetrical biceps, radial, and patellar tendons. Cerebellar - no tremor, no rigidity, fluid movement and normal gait. Derm - Head, neck, back, abdomen and extremities without suspicious lesions  Recent Results (from the past 2160 hour(s))  HEPATIC FUNCTION PANEL     Status: None   Collection Time    10/21/12 12:00 PM      Result Value Range   Total Bilirubin 0.9  0.3 - 1.2 mg/dL   Bilirubin, Direct 0.1  0.0 - 0.3 mg/dL   Alkaline Phosphatase 58  39 - 117 U/L   AST 18  0 - 37 U/L   ALT 15  0 - 35 U/L   Total Protein 7.8  6.0 - 8.3 g/dL   Albumin 4.2  3.5 - 5.2 g/dL  TSH     Status: None   Collection Time    10/21/12 12:00 PM      Result Value Range   TSH 0.65  0.35 - 5.50 uIU/mL  COMPREHENSIVE METABOLIC PANEL     Status: None   Collection Time    10/21/12 12:00 PM      Result Value Range   Sodium 138  135 - 145 mEq/L   Potassium 5.1  3.5 - 5.1 mEq/L   Chloride 103  96 - 112 mEq/L   CO2 28  19 - 32 mEq/L   Glucose, Bld 93  70 - 99 mg/dL   BUN 11  6 - 23 mg/dL   Creatinine, Ser 0.6  0.4 - 1.2 mg/dL   Total Bilirubin 0.9  0.3 - 1.2 mg/dL   Alkaline Phosphatase 58  39 - 117 U/L   AST 18  0 - 37 U/L   ALT 15  0 - 35 U/L   Total Protein 7.8  6.0 - 8.3 g/dL   Albumin 4.2  3.5 - 5.2 g/dL   Calcium 9.5  8.4 - 16.1 mg/dL   GFR 096.04  >54.09 mL/min  LIPID PANEL     Status: Abnormal   Collection Time    10/21/12 12:00 PM      Result Value Range   Cholesterol 232 (*) 0 - 200 mg/dL   Comment: ATP III Classification       Desirable:  < 200 mg/dL               Borderline High:  200 - 239 mg/dL          High:  > = 811 mg/dL   Triglycerides 91.4  0.0 - 149.0 mg/dL   Comment: Normal:  <782 mg/dLBorderline High:  150 - 199 mg/dL   HDL 95.62  >13.08 mg/dL   VLDL 65.7  0.0 - 84.6 mg/dL   Total CHOL/HDL  Ratio 4     Comment:                 Men          Women1/2 Average Risk     3.4          3.3Average Risk          5.0          4.42X Average Risk          9.6          7.13X Average Risk          15.0          11.0                      CBC WITH DIFFERENTIAL     Status: None   Collection Time    10/21/12 12:00 PM      Result Value Range   WBC 7.3  4.5 - 10.5 K/uL   RBC 4.58  3.87 - 5.11 Mil/uL   Hemoglobin 13.1  12.0 - 15.0 g/dL   HCT 09.8  11.9 - 14.7 %   MCV 82.8  78.0 - 100.0 fl   MCHC 34.4  30.0 - 36.0 g/dL   RDW 82.9  56.2 - 13.0 %   Platelets 307.0  150.0 - 400.0 K/uL   Neutrophils Relative % 60.1  43.0 - 77.0 %   Lymphocytes Relative 28.9  12.0 - 46.0 %   Monocytes Relative 7.5  3.0 - 12.0 %   Eosinophils Relative 3.1  0.0 - 5.0 %   Basophils Relative 0.4  0.0 - 3.0 %   Neutro Abs 4.4  1.4 - 7.7 K/uL   Lymphs Abs 2.1  0.7 - 4.0 K/uL   Monocytes Absolute 0.6  0.1 - 1.0 K/uL   Eosinophils Absolute 0.2  0.0 - 0.7 K/uL   Basophils Absolute 0.0  0.0 - 0.1 K/uL  T4, FREE     Status: None   Collection Time    10/21/12 12:00 PM      Result Value Range   Free T4 1.12  0.60 - 1.60 ng/dL  VITAMIN Q65     Status: None   Collection Time    10/21/12 12:00 PM      Result Value Range   Vitamin B-12 533  211 - 911 pg/mL  LDL CHOLESTEROL, DIRECT     Status: None   Collection Time    10/21/12 12:00 PM      Result Value Range   Direct LDL 159.0     Comment: Optimal:  <100 mg/dLNear or Above Optimal:  100-129 mg/dLBorderline High:  130-159 mg/dLHigh:  160-189 mg/dLVery High:  >190 mg/dL  LIPID PANEL     Status: Abnormal   Collection Time    01/05/13 10:16 AM      Result Value Range   Cholesterol 188  0 - 200 mg/dL   Comment: ATP III Classification       Desirable:  < 200 mg/dL               Borderline High:  200 - 239 mg/dL          High:  > = 784 mg/dL   Triglycerides 69.6  0.0 - 149.0 mg/dL   Comment: Normal:  <295 mg/dLBorderline High:  150 - 199 mg/dL   HDL 28.41  >32.44 mg/dL   VLDL 01.0  0.0 - 27.2 mg/dL   LDL  Cholesterol 106 (*) 0 - 99 mg/dL   Total CHOL/HDL Ratio 3     Comment:                Men          Women1/2 Average Risk     3.4          3.3Average Risk          5.0          4.42X Average Risk          9.6          7.13X Average Risk          15.0          11.0                             Assessment & Plan:

## 2013-01-05 NOTE — Patient Instructions (Signed)
It is good to see you. I am very sorry about your MOM. It is relevant to consider advanced care planning for yourself but you may also want to have this conversation with you Mom and family members. Please check out [email or web address]  Your physical exam is normal. Please make an appointment with your gyn. You should have an exam every 3 years. You need an eye exam every two years. See your dentist every six months.  Health maintenance - keep up the exercise and healthy diet.  High cholesterol - will check lab with recommendations to follow. We will get you to goal!  HAVE A HAPPY AND HEALTHY HOLIDAY.

## 2013-01-05 NOTE — Progress Notes (Signed)
Pre visit review using our clinic review tool, if applicable. No additional management support is needed unless otherwise documented below in the visit note. 

## 2013-01-06 DIAGNOSIS — Z0000 Encounter for general adult medical examination without abnormal findings: Secondary | ICD-10-CM | POA: Insufficient documentation

## 2013-01-06 NOTE — Assessment & Plan Note (Signed)
Taking and tolerating "statin" therapy with good results. LDL down from 159 to 106, goal is 130 or less. LFTs normal.  Plan Continue present medication

## 2013-01-06 NOTE — Assessment & Plan Note (Signed)
Stable at this time  - not her allergy season

## 2013-01-06 NOTE — Assessment & Plan Note (Signed)
Personal medical history is benign. PHysical exam sans breast and pelvic is normal. Labs reviewed and results are in normal limits. She will be seeing her gynecologist soon. Candidate for colorectal cancer screening at age 47. Immunizations are up to date. Discussed ACP, especially in light of her mother's recent stroke, and provided link to [email or web address]  In summary A very nice woman who appears to be medically stable. She will return as needed or in 1 year.

## 2013-03-05 ENCOUNTER — Other Ambulatory Visit: Payer: Self-pay | Admitting: Internal Medicine

## 2013-05-29 ENCOUNTER — Encounter: Payer: Self-pay | Admitting: Internal Medicine

## 2013-09-22 ENCOUNTER — Other Ambulatory Visit: Payer: Self-pay

## 2013-09-22 MED ORDER — NASONEX 50 MCG/ACT NA SUSP: 2.0000 | Freq: Every day | NASAL | Status: DC

## 2013-10-13 ENCOUNTER — Other Ambulatory Visit: Payer: Self-pay

## 2013-10-13 MED ORDER — SIMVASTATIN 20 MG PO TABS: ORAL_TABLET | ORAL | Status: DC

## 2013-10-13 NOTE — Telephone Encounter (Signed)
Former patient of Dr Linda Hedges No future appointments Last office visit 12/2012 along with lipid panel

## 2013-10-13 NOTE — Telephone Encounter (Signed)
#  30 New PCP needed

## 2013-12-23 ENCOUNTER — Telehealth: Payer: Self-pay | Admitting: Internal Medicine

## 2013-12-23 ENCOUNTER — Other Ambulatory Visit: Payer: Self-pay | Admitting: Internal Medicine

## 2013-12-23 MED ORDER — SIMVASTATIN 20 MG PO TABS: ORAL_TABLET | ORAL | Status: DC

## 2013-12-23 NOTE — Telephone Encounter (Signed)
Done

## 2013-12-23 NOTE — Telephone Encounter (Signed)
Ok to refill x2 see note below.

## 2013-12-23 NOTE — Addendum Note (Signed)
Addended by: Cresenciano Lick on: 12/23/2013 04:48 PM   Modules accepted: Orders, Medications

## 2013-12-23 NOTE — Telephone Encounter (Signed)
-----   Message from Cassandria Anger, MD sent at 12/23/2013  1:11 PM EST ----- Regarding: RE: former Dr Linda Hedges pt, requests you! Contact: [phone] OK to fill this prescription with additional refills x2. Pls sch w/Dr Doug Sou  Thank you!  ----- Message -----    From: Derry Skill    Sent: 12/23/2013  11:08 AM      To: Cassandria Anger, MD Subject: former Dr Linda Hedges pt, requests you!             Pt out of refills, needing Simvistastin, 20 mg, Allergy meds. She is requesting you but will need these refills. She is a former Dr Linda Hedges patient.  Will you accept her?  Malachy Mood

## 2014-02-15 ENCOUNTER — Telehealth: Payer: Self-pay | Admitting: Internal Medicine

## 2014-02-15 NOTE — Telephone Encounter (Signed)
Pt needs to transfer to another MD as Dr Linda Hedges was her MD. She does not want to a female MD and wanted me to check with Dr Jenny Reichmann. Do you accept?  [phone]

## 2014-02-16 NOTE — Telephone Encounter (Signed)
Ok with me 

## 2014-02-24 ENCOUNTER — Ambulatory Visit (INDEPENDENT_AMBULATORY_CARE_PROVIDER_SITE_OTHER): Payer: 59 | Admitting: Internal Medicine

## 2014-02-24 ENCOUNTER — Other Ambulatory Visit (INDEPENDENT_AMBULATORY_CARE_PROVIDER_SITE_OTHER): Payer: 59

## 2014-02-24 ENCOUNTER — Encounter: Payer: Self-pay | Admitting: Internal Medicine

## 2014-02-24 VITALS — BP 112/68 | HR 80 | Temp 97.9°F | Ht 60.0 in | Wt 110.4 lb

## 2014-02-24 DIAGNOSIS — H93A2 Pulsatile tinnitus, left ear: Secondary | ICD-10-CM

## 2014-02-24 DIAGNOSIS — J301 Allergic rhinitis due to pollen: Secondary | ICD-10-CM

## 2014-02-24 DIAGNOSIS — Z0000 Encounter for general adult medical examination without abnormal findings: Secondary | ICD-10-CM

## 2014-02-24 DIAGNOSIS — R51 Headache: Secondary | ICD-10-CM

## 2014-02-24 DIAGNOSIS — R519 Headache, unspecified: Secondary | ICD-10-CM | POA: Insufficient documentation

## 2014-02-24 DIAGNOSIS — H9312 Tinnitus, left ear: Secondary | ICD-10-CM

## 2014-02-24 LAB — BASIC METABOLIC PANEL
BUN: 18 mg/dL (ref 6–23)
CO2: 29 mEq/L (ref 19–32)
Calcium: 9.8 mg/dL (ref 8.4–10.5)
Chloride: 104 mEq/L (ref 96–112)
Creatinine, Ser: 0.66 mg/dL (ref 0.40–1.20)
GFR: 101.28 mL/min (ref >60.00)
Glucose, Bld: 95 mg/dL (ref 70–99)
POTASSIUM: 5.1 meq/L (ref 3.5–5.1)
Sodium: 139 mEq/L (ref 135–145)

## 2014-02-24 LAB — URINALYSIS, ROUTINE W REFLEX MICROSCOPIC
Bilirubin Urine: NEGATIVE
Ketones, ur: NEGATIVE
LEUKOCYTES UA: NEGATIVE
Nitrite: NEGATIVE
PH: 8 (ref 5.0–8.0)
SPECIFIC GRAVITY, URINE: 1.015 (ref 1.000–1.030)
Total Protein, Urine: NEGATIVE
URINE GLUCOSE: NEGATIVE
Urobilinogen, UA: 0.2 (ref 0.0–1.0)

## 2014-02-24 LAB — LIPID PANEL
Cholesterol: 186 mg/dL (ref 0–200)
HDL: 60.9 mg/dL (ref >39.00)
LDL Cholesterol: 104 mg/dL — ABNORMAL HIGH (ref 0–99)
NONHDL: 125.1
Total CHOL/HDL Ratio: 3
Triglycerides: 106 mg/dL (ref 0.0–149.0)
VLDL: 21.2 mg/dL (ref 0.0–40.0)

## 2014-02-24 LAB — CBC WITH DIFFERENTIAL/PLATELET
Basophils Absolute: 0 10*3/uL (ref 0.0–0.1)
Basophils Relative: 0.4 % (ref 0.0–3.0)
EOS PCT: 3.1 % (ref 0.0–5.0)
Eosinophils Absolute: 0.2 10*3/uL (ref 0.0–0.7)
HEMATOCRIT: 38.7 % (ref 36.0–46.0)
HEMOGLOBIN: 13.3 g/dL (ref 12.0–15.0)
Lymphocytes Relative: 30.7 % (ref 12.0–46.0)
Lymphs Abs: 2.2 10*3/uL (ref 0.7–4.0)
MCHC: 34.5 g/dL (ref 30.0–36.0)
MCV: 84.2 fl (ref 78.0–100.0)
MONOS PCT: 8.4 % (ref 3.0–12.0)
Monocytes Absolute: 0.6 10*3/uL (ref 0.1–1.0)
Neutro Abs: 4.2 10*3/uL (ref 1.4–7.7)
Neutrophils Relative %: 57.4 % (ref 43.0–77.0)
PLATELETS: 304 10*3/uL (ref 150.0–400.0)
RBC: 4.6 Mil/uL (ref 3.87–5.11)
RDW: 13.5 % (ref 11.5–15.5)
WBC: 7.3 10*3/uL (ref 4.0–10.5)

## 2014-02-24 LAB — HEPATIC FUNCTION PANEL
ALBUMIN: 4.5 g/dL (ref 3.5–5.2)
ALK PHOS: 70 U/L (ref 39–117)
ALT: 32 U/L (ref 0–35)
AST: 26 U/L (ref 0–37)
BILIRUBIN TOTAL: 0.5 mg/dL (ref 0.2–1.2)
Bilirubin, Direct: 0.1 mg/dL (ref 0.0–0.3)
TOTAL PROTEIN: 7.8 g/dL (ref 6.0–8.3)

## 2014-02-24 LAB — TSH: TSH: 0.76 u[IU]/mL (ref 0.35–4.50)

## 2014-02-24 MED ORDER — NASONEX 50 MCG/ACT NA SUSP: 2.0000 | Freq: Every day | NASAL | Status: DC

## 2014-02-24 MED ORDER — SIMVASTATIN 20 MG PO TABS: ORAL_TABLET | ORAL | Status: DC

## 2014-02-24 MED ORDER — OLOPATADINE HCL 0.1 % OP SOLN: 1.0000 [drp] | Freq: Two times a day (BID) | OPHTHALMIC | Status: DC

## 2014-02-24 NOTE — Progress Notes (Signed)
Pre visit review using our clinic review tool, if applicable. No additional management support is needed unless otherwise documented below in the visit note. 

## 2014-02-24 NOTE — Progress Notes (Signed)
Subjective:    Patient ID: Lindsey Boyd, female    DOB: 03-08-1965, 49 y.o.   MRN: [id number]  HPI  Here for wellness and f/u;  Overall doing ok;  Pt denies CP, worsening SOB, DOE, wheezing, orthopnea, PND, worsening LE edema, palpitations, dizziness or syncope.  Pt denies neurological change such as facial or extremity weakness, but with occipitalparietal HA and worsening left pulsing type of tinnitus over 1 month. .  Pt denies polydipsia, polyuria, or low sugar symptoms. Pt states overall good compliance with treatment and medications, good tolerability, and has been trying to follow lower cholesterol diet.  Pt denies worsening depressive symptoms, suicidal ideation or panic. No fever, night sweats, wt loss, loss of appetite, or other constitutional symptoms.  Pt states good ability with ADL's, has low fall risk, home safety reviewed and adequate, no other significant changes in hearing or vision, and only occasionally active with exercise. Has appt for pap in 2-3 wks  Does also have recurring bilat eye d/c without pain, fever, or vision change. Past Medical History  Diagnosis Date  . Allergy   . Hyperlipidemia    No past surgical history on file.  reports that she has never smoked. She has never used smokeless tobacco. She reports that she does not drink alcohol or use illicit drugs. family history includes Cancer in her maternal grandfather and maternal grandmother; Cataracts in her brother, brother, and brother; Diabetes in her mother; Hyperlipidemia in her mother; Hypertension in her mother; Hyperthyroidism in her mother; Hypothyroidism in her sister; Osteoporosis in her mother. No Known Allergies No current outpatient prescriptions on file prior to visit.   No current facility-administered medications on file prior to visit.   .   Review of Systems Constitutional: Negative for increased diaphoresis, other activity, appetite or other siginficant weight change  HENT: Negative for worsening  hearing loss, ear pain, facial swelling, mouth sores and neck stiffness.   Eyes: Negative for other worsening pain, redness or visual disturbance.  Respiratory: Negative for shortness of breath and wheezing.   Cardiovascular: Negative for chest pain and palpitations.  Gastrointestinal: Negative for diarrhea, blood in stool, abdominal distention or other pain Genitourinary: Negative for hematuria, flank pain or change in urine volume.  Musculoskeletal: Negative for myalgias or other joint complaints.  Skin: Negative for color change and wound.  Neurological: Negative for syncope and numbness. other than noted Hematological: Negative for adenopathy. or other swelling Psychiatric/Behavioral: Negative for hallucinations, self-injury, decreased concentration or other worsening agitation.      Objective:   Physical Exam BP 112/68 mmHg  Pulse 80  Temp(Src) 97.9 F (36.6 C) (Oral)  Ht 5' (1.524 m)  Wt 110 lb 6 oz (50.066 kg)  BMI 21.56 kg/m2  SpO2 97%  VS noted,  Constitutional: Pt is oriented to person, place, and time. Appears well-developed and well-nourished.  Head: Normocephalic and atraumatic.  Right Ear: External ear normal.  Left Ear: External ear normal.  Nose: Nose normal.  Mouth/Throat: Oropharynx is clear and moist.  Eyes: Conjunctivae with mild bilat clear d/c; and EOM are normal. Pupils are equal, round, and reactive to light.  Neck: Normal range of motion. Neck supple. No JVD present. No tracheal deviation present.  Cardiovascular: Normal rate, regular rhythm, normal heart sounds and intact distal pulses.   Pulmonary/Chest: Effort normal and breath sounds without rales or wheezing  Abdominal: Soft. Bowel sounds are normal. NT. No HSM  Musculoskeletal: Normal range of motion. Exhibits no edema.  Lymphadenopathy:  Has  no cervical adenopathy.  Neurological: Pt is alert and oriented to person, place, and time. Pt has normal reflexes. No cranial nerve deficit. Motor 5/5intact,  sens/dtr/cn 2-12 intact, gait observed without abnormal Skin: Skin is warm and dry. No rash noted.  Psychiatric:  Has normal mood and affect. Behavior is normal.     Assessment & Plan:

## 2014-02-24 NOTE — Patient Instructions (Addendum)
Please take all new medication as prescribed  - the patanol  Please continue all other medications as before, and refills have been done if requested.  Please have the pharmacy call with any other refills you may need.  Please continue your efforts at being more active, low cholesterol diet, and weight control.  You are otherwise up to date with prevention measures today.  Please keep your appointments with your specialists as you may have planned  You will be contacted regarding the referral for: mammogram, and Head MRI  Please go to the LAB in the Basement (turn left off the elevator) for the tests to be done today  You will be contacted by phone if any changes need to be made immediately.  Otherwise, you will receive a letter about your results with an explanation, but please check with MyChart first.  Please remember to sign up for MyChart if you have not done so, as this will be important to you in the future with finding out test results, communicating by private email, and scheduling acute appointments online when needed.  Please return in 1 year for your yearly visit, or sooner if needed, with Lab testing done 3-5 days before

## 2014-02-27 NOTE — Assessment & Plan Note (Signed)
For patanol asd prn,  to f/u any worsening symptoms or concerns

## 2014-02-27 NOTE — Assessment & Plan Note (Signed)
For MRI as above 

## 2014-02-27 NOTE — Assessment & Plan Note (Signed)

## 2014-02-27 NOTE — Assessment & Plan Note (Signed)
?   Etiology, exam benign, consider ENT, for MRI brain,  to f/u any worsening symptoms or concerns

## 2014-03-09 ENCOUNTER — Other Ambulatory Visit: Payer: Self-pay | Admitting: Internal Medicine

## 2014-03-09 DIAGNOSIS — Z1231 Encounter for screening mammogram for malignant neoplasm of breast: Secondary | ICD-10-CM

## 2014-03-18 ENCOUNTER — Ambulatory Visit
Admission: RE | Admit: 2014-03-18 | Discharge: 2014-03-18 | Disposition: A | Discharge status: Home / Self Care | Payer: 59 | Source: Ambulatory Visit | Attending: Internal Medicine | Admitting: Internal Medicine

## 2014-03-18 ENCOUNTER — Other Ambulatory Visit: Payer: Self-pay | Admitting: Internal Medicine

## 2014-03-18 DIAGNOSIS — H93A2 Pulsatile tinnitus, left ear: Secondary | ICD-10-CM

## 2014-03-18 DIAGNOSIS — R51 Headache: Secondary | ICD-10-CM

## 2014-03-18 DIAGNOSIS — R519 Headache, unspecified: Secondary | ICD-10-CM

## 2014-05-21 ENCOUNTER — Other Ambulatory Visit: Payer: Self-pay | Admitting: *Deleted

## 2014-05-21 MED ORDER — MOMETASONE FUROATE 50 MCG/ACT NA SUSP: 2.0000 | Freq: Every day | NASAL | Status: DC

## 2014-09-01 ENCOUNTER — Emergency Department
Admission: EM | Admit: 2014-09-01 | Discharge: 2014-09-01 | Disposition: A | Discharge status: Home / Self Care | Payer: 59 | Source: Home / Self Care | Attending: Family Medicine | Admitting: Family Medicine

## 2014-09-01 ENCOUNTER — Encounter: Payer: Self-pay | Admitting: Emergency Medicine

## 2014-09-01 DIAGNOSIS — H109 Unspecified conjunctivitis: Secondary | ICD-10-CM

## 2014-09-01 DIAGNOSIS — Z973 Presence of spectacles and contact lenses: Secondary | ICD-10-CM | POA: Diagnosis not present

## 2014-09-01 DIAGNOSIS — H1089 Other conjunctivitis: Secondary | ICD-10-CM | POA: Diagnosis not present

## 2014-09-01 DIAGNOSIS — A499 Bacterial infection, unspecified: Secondary | ICD-10-CM | POA: Diagnosis not present

## 2014-09-01 MED ORDER — POLYMYXIN B-TRIMETHOPRIM 10000-0.1 UNIT/ML-% OP SOLN: 1.0000 [drp] | OPHTHALMIC | Status: DC

## 2014-09-01 NOTE — ED Provider Notes (Signed)
CSN: [id number]     Arrival date & time 09/01/14  1403 History   First MD Initiated Contact with Patient 09/01/14 1421     Chief Complaint  Patient presents with  . Eye Pain   (Consider location/radiation/quality/duration/timing/severity/associated sxs/prior Treatment) HPI The patient is a 49 year old female presenting to urgent care with complaints of right eye pain and yellow discharge for the last 4 days. Pain is mild aching and burning at times.  No medication taken PTA. Patient states she does wear contacts that last up to 2 weeks, so she does sleep in them at night.  Since states she cleans them as indicated on the box.  Denies any recent change and contact brand or solution.  Denies known trauma to eye.  Denies fevers, chills, nausea, vomiting, diarrhea.  Denies cough or congestion.  Denies change in vision.  Past Medical History  Diagnosis Date  . Allergy   . Hyperlipidemia    History reviewed. No pertinent past surgical history. Family History  Problem Relation Age of Onset  . Diabetes Mother   . Hyperlipidemia Mother   . Hypertension Mother   . Osteoporosis Mother   . Hyperthyroidism Mother   . Cancer Maternal Grandmother     breast  . Cancer Maternal Grandfather     liver  . Hypothyroidism Sister   . Cataracts Brother   . Cataracts Brother   . Cataracts Brother    History  Substance Use Topics  . Smoking status: Never Smoker   . Smokeless tobacco: Never Used  . Alcohol Use: No   OB History    No data available     Review of Systems  Constitutional: Negative for fever and chills.  HENT: Negative for congestion, rhinorrhea and sore throat.   Eyes: Positive for pain, discharge, redness and itching. Negative for photophobia and visual disturbance.  Respiratory: Negative for cough and shortness of breath.   Gastrointestinal: Negative for nausea and vomiting.  Neurological: Negative for headaches.    Allergies  Review of patient's allergies indicates no known  allergies.  Home Medications   Prior to Admission medications   Medication Sig Start Date End Date Taking? Authorizing Provider  mometasone (NASONEX) 50 MCG/ACT nasal spray Place 2 sprays into the nose daily. 05/21/14   Biagio Borg, MD  olopatadine (PATANOL) 0.1 % ophthalmic solution Place 1 drop into both eyes 2 (two) times daily. 02/24/14   Biagio Borg, MD  simvastatin (ZOCOR) 20 MG tablet TAKE 1 TABLET BY MOUTH DAILY FOR CHOLESTEROL MANAGEMENT 02/24/14   Biagio Borg, MD  trimethoprim-polymyxin b [facility]) ophthalmic solution Place 1 drop into the right eye every 4 (four) hours. For 5 days 09/01/14   Noland Fordyce, PA-C   BP 108/69 mmHg  Pulse 81  Temp(Src) 98.5 F (36.9 C) (Oral)  Ht 5' (1.524 m)  Wt 109 lb (49.442 kg)  BMI 21.29 kg/m2  SpO2 97% Physical Exam  Constitutional: She is oriented to person, place, and time. She appears well-developed and well-nourished.  HENT:  Head: Normocephalic and atraumatic.  Right Ear: Hearing, tympanic membrane, external ear and ear canal normal.  Left Ear: Hearing, tympanic membrane, external ear and ear canal normal.  Nose: Nose normal.  Mouth/Throat: Uvula is midline, oropharynx is clear and moist and mucous membranes are normal.  Eyes: EOM are normal. Pupils are equal, round, and reactive to light. Right eye exhibits discharge (scant yellow discharge). Right eye exhibits no hordeolum. No foreign body present in the right eye. Right  conjunctiva is injected ( mild). Right conjunctiva has no hemorrhage.  Slit lamp exam:      The right eye shows no corneal abrasion, no corneal flare, no corneal ulcer and no fluorescein uptake.  Neck: Normal range of motion. Neck supple.  Cardiovascular: Normal rate.   Pulmonary/Chest: Effort normal.  Musculoskeletal: Normal range of motion.  Neurological: She is alert and oriented to person, place, and time.  Skin: Skin is warm and dry.  Psychiatric: She has a normal mood and affect. Her behavior is normal.   Nursing note and vitals reviewed.   ED Course  Procedures (including critical care time) Labs Review Labs Reviewed - No data to display  Imaging Review No results found.   MDM   1. Bacterial conjunctivitis of right eye   2. Wears contact lenses    The patient is a 49 year old female with signs and symptoms consistent with bacterial conjunctivitis. Patient does wear contacts and sleeps with them at night. Will start patient on Polytrim ophthalmic drops every 4 hours 5 days. Advised to follow-up with her ophthalmologist PCP in 3-4 days if not improving. Encouraged to throw out her current contacts and only wear her glasses until symptoms resolve. Patient verbalized understanding and agreement with treatment plan.     Noland Fordyce, PA-C 09/01/14 1439

## 2014-09-01 NOTE — Discharge Instructions (Signed)
Please be sure to throw out the contacts you used last.  Please use your glasses until symptoms resolve. Follow up with your ophthalmologist in 3-4 days if not improving, sooner if worsening.  Bacterial Conjunctivitis Bacterial conjunctivitis (commonly called pink eye) is redness, soreness, or puffiness (inflammation) of the white part of your eye. It is caused by a germ called bacteria. These germs can easily spread from person to person (contagious). Your eye often will become red or pink. Your eye may also become irritated, watery, or have a thick discharge.  HOME CARE   Apply a cool, clean washcloth over closed eyelids. Do this for 10-20 minutes, 3-4 times a day while you have pain.  Gently wipe away any fluid coming from the eye with a warm, wet washcloth or cotton ball.  Wash your hands often with soap and water. Use paper towels to dry your hands.  Do not share towels or washcloths.  Change or wash your pillowcase every day.  Do not use eye makeup until the infection is gone.  Do not use machines or drive if your vision is blurry.  Stop using contact lenses. Do not use them again until your doctor says it is okay.  Do not touch the tip of the eye drop bottle or medicine tube with your fingers when you put medicine on the eye. GET HELP RIGHT AWAY IF:   Your eye is not better after 3 days of starting your medicine.  You have a yellowish fluid coming out of the eye.  You have more pain in the eye.  Your eye redness is spreading.  Your vision becomes blurry.  You have a fever or lasting symptoms for more than 2-3 days.  You have a fever and your symptoms suddenly get worse.  You have pain in the face.  Your face gets red or puffy (swollen). MAKE SURE YOU:   Understand these instructions.  Will watch this condition.  Will get help right away if you are not doing well or get worse. Document Released: 10/25/2007 Document Revised: 01/02/2012 Document Reviewed:  09/21/2011 [facility] Patient Information 2015 [place], [place]. This information is not intended to replace advice given to you by your health care provider. Make sure you discuss any questions you have with your health care provider.

## 2014-09-01 NOTE — ED Notes (Signed)
RT eye pain, discharge, painful x 4 days

## 2015-03-03 ENCOUNTER — Other Ambulatory Visit: Payer: Self-pay | Admitting: Internal Medicine

## 2015-03-03 MED FILL — SIMVASTATIN 20 MG TABLET: 20 | 90 days supply | Qty: 90 | Fill #0

## 2015-03-08 DIAGNOSIS — R42 Dizziness and giddiness: Secondary | ICD-10-CM | POA: Diagnosis not present

## 2015-03-21 DIAGNOSIS — Z01411 Encounter for gynecological examination (general) (routine) with abnormal findings: Secondary | ICD-10-CM | POA: Diagnosis not present

## 2015-03-21 DIAGNOSIS — Z01419 Encounter for gynecological examination (general) (routine) without abnormal findings: Secondary | ICD-10-CM | POA: Diagnosis not present

## 2015-03-21 DIAGNOSIS — N939 Abnormal uterine and vaginal bleeding, unspecified: Secondary | ICD-10-CM | POA: Diagnosis not present

## 2015-03-21 DIAGNOSIS — N938 Other specified abnormal uterine and vaginal bleeding: Secondary | ICD-10-CM | POA: Diagnosis not present

## 2015-05-16 ENCOUNTER — Other Ambulatory Visit: Payer: Self-pay | Admitting: Internal Medicine

## 2015-05-16 MED FILL — MOMETASONE FUROATE 50 MCG S: 50 | 30 days supply | Qty: 17 | Fill #2

## 2015-05-16 MED FILL — OLOPATADINE HCL 0.1% EYE DR: 0.1 | 25 days supply | Qty: 5 | Fill #0

## 2015-06-13 ENCOUNTER — Other Ambulatory Visit: Payer: Self-pay | Admitting: Internal Medicine

## 2015-06-14 MED FILL — SIMVASTATIN 20 MG TABLET: 20 | 90 days supply | Qty: 90 | Fill #0

## 2015-06-24 DIAGNOSIS — H524 Presbyopia: Secondary | ICD-10-CM | POA: Diagnosis not present

## 2015-06-24 DIAGNOSIS — H5213 Myopia, bilateral: Secondary | ICD-10-CM | POA: Diagnosis not present

## 2015-10-10 ENCOUNTER — Other Ambulatory Visit: Payer: Self-pay | Admitting: Internal Medicine

## 2015-10-11 ENCOUNTER — Other Ambulatory Visit: Payer: Self-pay | Admitting: Internal Medicine

## 2015-10-11 MED FILL — MOMETASONE FUROATE 50 MCG S: 50 | 30 days supply | Qty: 17 | Fill #0

## 2015-10-11 MED FILL — SIMVASTATIN 20 MG TABLET: 20 | 90 days supply | Qty: 90 | Fill #0

## 2015-10-11 MED FILL — OLOPATADINE HCL 0.1% EYE DR: 0.1 | 25 days supply | Qty: 5 | Fill #1

## 2015-10-19 ENCOUNTER — Ambulatory Visit (INDEPENDENT_AMBULATORY_CARE_PROVIDER_SITE_OTHER): Payer: 59 | Admitting: Internal Medicine

## 2015-10-19 ENCOUNTER — Encounter: Payer: Self-pay | Admitting: Internal Medicine

## 2015-10-19 ENCOUNTER — Other Ambulatory Visit (INDEPENDENT_AMBULATORY_CARE_PROVIDER_SITE_OTHER): Payer: 59

## 2015-10-19 VITALS — BP 116/64 | HR 70 | Temp 98.4°F | Resp 20 | Wt 111.0 lb

## 2015-10-19 DIAGNOSIS — H578 Other specified disorders of eye and adnexa: Secondary | ICD-10-CM | POA: Diagnosis not present

## 2015-10-19 DIAGNOSIS — Z0001 Encounter for general adult medical examination with abnormal findings: Secondary | ICD-10-CM

## 2015-10-19 DIAGNOSIS — Z23 Encounter for immunization: Secondary | ICD-10-CM

## 2015-10-19 DIAGNOSIS — H6982 Other specified disorders of Eustachian tube, left ear: Secondary | ICD-10-CM | POA: Diagnosis not present

## 2015-10-19 DIAGNOSIS — Z1211 Encounter for screening for malignant neoplasm of colon: Secondary | ICD-10-CM | POA: Diagnosis not present

## 2015-10-19 DIAGNOSIS — Z0000 Encounter for general adult medical examination without abnormal findings: Secondary | ICD-10-CM

## 2015-10-19 DIAGNOSIS — H5789 Other specified disorders of eye and adnexa: Secondary | ICD-10-CM

## 2015-10-19 DIAGNOSIS — R6889 Other general symptoms and signs: Secondary | ICD-10-CM

## 2015-10-19 DIAGNOSIS — J301 Allergic rhinitis due to pollen: Secondary | ICD-10-CM

## 2015-10-19 DIAGNOSIS — H6992 Unspecified Eustachian tube disorder, left ear: Secondary | ICD-10-CM

## 2015-10-19 LAB — HEPATIC FUNCTION PANEL
ALT: 22 U/L (ref 0–35)
AST: 19 U/L (ref 0–37)
Albumin: 4.5 g/dL (ref 3.5–5.2)
Alkaline Phosphatase: 64 U/L (ref 39–117)
BILIRUBIN TOTAL: 0.4 mg/dL (ref 0.2–1.2)
Bilirubin, Direct: 0 mg/dL (ref 0.0–0.3)
Total Protein: 8 g/dL (ref 6.0–8.3)

## 2015-10-19 LAB — CBC WITH DIFFERENTIAL/PLATELET
Basophils Absolute: 0 10*3/uL (ref 0.0–0.1)
Basophils Relative: 0.4 % (ref 0.0–3.0)
EOS ABS: 0.2 10*3/uL (ref 0.0–0.7)
EOS PCT: 2.8 % (ref 0.0–5.0)
HCT: 37.9 % (ref 36.0–46.0)
HEMOGLOBIN: 13.2 g/dL (ref 12.0–15.0)
LYMPHS PCT: 32.2 % (ref 12.0–46.0)
Lymphs Abs: 2.4 10*3/uL (ref 0.7–4.0)
MCHC: 34.7 g/dL (ref 30.0–36.0)
MCV: 82.9 fl (ref 78.0–100.0)
MONO ABS: 0.6 10*3/uL (ref 0.1–1.0)
Monocytes Relative: 8.5 % (ref 3.0–12.0)
Neutro Abs: 4.2 10*3/uL (ref 1.4–7.7)
Neutrophils Relative %: 56.1 % (ref 43.0–77.0)
Platelets: 287 10*3/uL (ref 150.0–400.0)
RBC: 4.57 Mil/uL (ref 3.87–5.11)
RDW: 13.4 % (ref 11.5–15.5)
WBC: 7.5 10*3/uL (ref 4.0–10.5)

## 2015-10-19 LAB — TSH: TSH: 0.65 u[IU]/mL (ref 0.35–4.50)

## 2015-10-19 LAB — BASIC METABOLIC PANEL
BUN: 18 mg/dL (ref 6–23)
CO2: 31 mEq/L (ref 19–32)
CREATININE: 0.98 mg/dL (ref 0.40–1.20)
Calcium: 9.5 mg/dL (ref 8.4–10.5)
Chloride: 104 mEq/L (ref 96–112)
GFR: 63.75 mL/min (ref >60.00)
GLUCOSE: 100 mg/dL — AB (ref 70–99)
Potassium: 5 mEq/L (ref 3.5–5.1)
Sodium: 140 mEq/L (ref 135–145)

## 2015-10-19 LAB — URINALYSIS, ROUTINE W REFLEX MICROSCOPIC
BILIRUBIN URINE: NEGATIVE
KETONES UR: NEGATIVE
Leukocytes, UA: NEGATIVE
Nitrite: NEGATIVE
Specific Gravity, Urine: 1.015 (ref 1.000–1.030)
TOTAL PROTEIN, URINE-UPE24: NEGATIVE
URINE GLUCOSE: NEGATIVE
Urobilinogen, UA: 0.2 (ref 0.0–1.0)
pH: 7 (ref 5.0–8.0)

## 2015-10-19 LAB — LIPID PANEL
CHOL/HDL RATIO: 3
CHOLESTEROL: 200 mg/dL (ref 0–200)
HDL: 66.9 mg/dL (ref >39.00)
LDL CALC: 107 mg/dL — AB (ref 0–99)
NonHDL: 133.52
TRIGLYCERIDES: 135 mg/dL (ref 0.0–149.0)
VLDL: 27 mg/dL (ref 0.0–40.0)

## 2015-10-19 NOTE — Patient Instructions (Addendum)
You had the flu shot today  OK to take the OTC zyrtec and nasacort, as well as mucinex for the left year  Please continue all other medications as before, and refills have been done if requested.  Please have the pharmacy call with any other refills you may need.  Please continue your efforts at being more active, low cholesterol diet, and weight control.  You are otherwise up to date with prevention measures today.  Please keep your appointments with your specialists as you may have planned  You will be contacted regarding the referral for: mammogram, and colonoscopy, opthamology ([facility])  Please go to the LAB in the Basement (turn left off the elevator) for the tests to be done today  You will be contacted by phone if any changes need to be made immediately.  Otherwise, you will receive a letter about your results with an explanation, but please check with MyChart first.  Please remember to sign up for MyChart if you have not done so, as this will be important to you in the future with finding out test results, communicating by private email, and scheduling acute appointments online when needed.  Please return in 1 year for your yearly visit, or sooner if needed, with Lab testing done 3-5 days before

## 2015-10-19 NOTE — Progress Notes (Signed)
Pre visit review using our clinic review tool, if applicable. No additional management support is needed unless otherwise documented below in the visit note. 

## 2015-10-19 NOTE — Progress Notes (Signed)
Subjective:    Patient ID: Lindsey Boyd, female    DOB: May 14, 1965, 50 y.o.   MRN: [id number]  HPI Here for wellness and f/u;  Overall doing ok;  Pt denies Chest pain, worsening SOB, DOE, wheezing, orthopnea, PND, worsening LE edema, palpitations, dizziness or syncope.  Pt denies neurological change such as new headache, facial or extremity weakness.  Pt denies polydipsia, polyuria, or low sugar symptoms. Pt states overall good compliance with treatment and medications, good tolerability, and has been trying to follow appropriate diet.  Pt denies worsening depressive symptoms, suicidal ideation or panic. No fever, night sweats, wt loss, loss of appetite, or other constitutional symptoms.  Pt states good ability with ADL's, has low fall risk, home safety reviewed and adequate, no other significant changes in hearing or vision, and only occasionally active with exercise. Wt Readings from Last 3 Encounters:  10/19/15 111 lb (50.3 kg)  09/01/14 109 lb (49.4 kg)  02/24/14 110 lb 6 oz (50.1 kg)   BP Readings from Last 3 Encounters:  10/19/15 116/64  09/01/14 108/69  02/24/14 112/68  Does have several wks ongoing nasal allergy symptoms with clearish congestion, itch and sneezing, without fever, pain, ST, cough, swelling or wheezing.  Also some popping and crackling to left ear.  Tinnitus to left ear as well, mild, intermittent, also has significant eye drainage with weepiness and d/c color not clear, asks for optho referral Past Medical History:  Diagnosis Date  . Allergy   . Hyperlipidemia    No past surgical history on file.  reports that she has never smoked. She has never used smokeless tobacco. She reports that she does not drink alcohol or use drugs. family history includes Cancer in her maternal grandfather and maternal grandmother; Cataracts in her brother, brother, and brother; Diabetes in her mother; Hyperlipidemia in her mother; Hypertension in her mother; Hyperthyroidism in her mother;  Hypothyroidism in her sister; Osteoporosis in her mother. No Known Allergies Current Outpatient Prescriptions on File Prior to Visit  Medication Sig Dispense Refill  . mometasone (NASONEX) 50 MCG/ACT nasal spray INSTILL 2 SPRAYS INTO THE NOSE DAILY 17 g 11  . olopatadine (PATANOL) 0.1 % ophthalmic solution INSTILL 1 DROP INTO BOTH EYES 2 TIMES DAILY. 5 mL 12  . simvastatin (ZOCOR) 20 MG tablet TAKE 1 TABLET BY MOUTH ONCE DAILY FOR CHOLESTEROL MANAGEMENT 90 tablet 0  . trimethoprim-polymyxin b (POLYTRIM) ophthalmic solution Place 1 drop into the right eye every 4 (four) hours. For 5 days 10 mL 0   No current facility-administered medications on file prior to visit.    Review of Systems Constitutional: Negative for increased diaphoresis, or other activity, appetite or siginficant weight change other than noted HENT: Negative for worsening hearing loss, ear pain, facial swelling, mouth sores and neck stiffness.   Eyes: Negative for other worsening pain, redness or visual disturbance.  Respiratory: Negative for choking or stridor Cardiovascular: Negative for other chest pain and palpitations.  Gastrointestinal: Negative for worsening diarrhea, blood in stool, or abdominal distention Genitourinary: Negative for hematuria, flank pain or change in urine volume.  Musculoskeletal: Negative for myalgias or other joint complaints.  Skin: Negative for other color change and wound or drainage.  Neurological: Negative for syncope and numbness. other than noted Hematological: Negative for adenopathy. or other swelling Psychiatric/Behavioral: Negative for hallucinations, SI, self-injury, decreased concentration or other worsening agitation.      Objective:   Physical Exam BP 116/64   Pulse 70   Temp 98.4 F (  36.9 C) (Oral)   Resp 20   Wt 111 lb (50.3 kg)   SpO2 96%   BMI 21.68 kg/m  VS noted,  Constitutional: Pt is oriented to person, place, and time. Appears well-developed and well-nourished,  in no significant distress Head: Normocephalic and atraumatic  Eyes: Conjunctivae and EOM are normal. Pupils are equal, round, and reactive to light Right Ear: External ear normal.  Left Ear: External ear normal Nose: Nose normal.  Mouth/Throat: Oropharynx is clear and moist  Bilat tm's with mild erythema.  Max sinus areas non tender.  Pharynx with mild erythema, no exudate Neck: Normal range of motion. Neck supple. No JVD present. No tracheal deviation present or significant neck LA or mass Cardiovascular: Normal rate, regular rhythm, normal heart sounds and intact distal pulses.   Pulmonary/Chest: Effort normal and breath sounds without rales or wheezing  Abdominal: Soft. Bowel sounds are normal. NT. No HSM  Musculoskeletal: Normal range of motion. Exhibits no edema Lymphadenopathy: Has no cervical adenopathy.  Neurological: Pt is alert and oriented to person, place, and time. Pt has normal reflexes. No cranial nerve deficit. Motor grossly intact Skin: Skin is warm and dry. No rash noted or new ulcers Psychiatric:  Has normal mood and affect. Behavior is normal.         Assessment & Plan:

## 2015-10-22 DIAGNOSIS — H6990 Unspecified Eustachian tube disorder, unspecified ear: Secondary | ICD-10-CM | POA: Insufficient documentation

## 2015-10-22 DIAGNOSIS — H5789 Other specified disorders of eye and adnexa: Secondary | ICD-10-CM | POA: Insufficient documentation

## 2015-10-22 DIAGNOSIS — H6980 Other specified disorders of Eustachian tube, unspecified ear: Secondary | ICD-10-CM | POA: Insufficient documentation

## 2015-10-22 NOTE — Assessment & Plan Note (Signed)
Mild to mod, for otc zyrtec and nasacort,  to f/u any worsening symptoms or concerns

## 2015-10-22 NOTE — Assessment & Plan Note (Addendum)
Suspect possible allergy related, but will accede to pt wish - refer optho, also for tx as above  In addition to the time spent performing CPE, I spent an additional 15 minutes face to face,in which greater than 50% of this time was spent in counseling and coordination of care for patient's illness as documented.

## 2015-10-22 NOTE — Assessment & Plan Note (Signed)
Mild to mod, for mucinex otc prn,  to f/u any worsening symptoms or concerns 

## 2015-10-22 NOTE — Assessment & Plan Note (Signed)

## 2015-11-02 DIAGNOSIS — H04123 Dry eye syndrome of bilateral lacrimal glands: Secondary | ICD-10-CM | POA: Diagnosis not present

## 2015-11-02 MED FILL — TOBRAMYCIN-DEXAMETH OPTH SU: 0.3-0.1 | 14 days supply | Qty: 5 | Fill #0

## 2015-11-19 ENCOUNTER — Other Ambulatory Visit: Payer: Self-pay | Admitting: Internal Medicine

## 2015-11-19 DIAGNOSIS — Z1231 Encounter for screening mammogram for malignant neoplasm of breast: Secondary | ICD-10-CM

## 2015-11-29 ENCOUNTER — Ambulatory Visit
Admission: RE | Admit: 2015-11-29 | Discharge: 2015-11-29 | Disposition: A | Discharge status: Home / Self Care | Payer: 59 | Source: Ambulatory Visit | Attending: Internal Medicine | Admitting: Internal Medicine

## 2015-11-29 DIAGNOSIS — Z1231 Encounter for screening mammogram for malignant neoplasm of breast: Secondary | ICD-10-CM

## 2015-12-01 ENCOUNTER — Encounter: Payer: Self-pay | Admitting: Internal Medicine

## 2016-01-11 ENCOUNTER — Other Ambulatory Visit: Payer: Self-pay | Admitting: Internal Medicine

## 2016-01-11 MED FILL — SIMVASTATIN 20 MG TABLET: 20 | 90 days supply | Qty: 90 | Fill #0

## 2016-02-21 ENCOUNTER — Encounter: Payer: Self-pay | Admitting: Internal Medicine

## 2016-02-21 ENCOUNTER — Ambulatory Visit (INDEPENDENT_AMBULATORY_CARE_PROVIDER_SITE_OTHER): Payer: 59 | Admitting: Internal Medicine

## 2016-02-21 DIAGNOSIS — E785 Hyperlipidemia, unspecified: Secondary | ICD-10-CM | POA: Diagnosis not present

## 2016-02-21 DIAGNOSIS — G514 Facial myokymia: Secondary | ICD-10-CM | POA: Diagnosis not present

## 2016-02-21 DIAGNOSIS — R200 Anesthesia of skin: Secondary | ICD-10-CM

## 2016-02-21 NOTE — Progress Notes (Signed)
Pre visit review using our clinic review tool, if applicable. No additional management support is needed unless otherwise documented below in the visit note. 

## 2016-02-21 NOTE — Progress Notes (Signed)
Subjective:    Patient ID: Lindsey Boyd, female    DOB: 1965-09-08, 51 y.o.   MRN: [id number]  HPI  Here to c/o > 3 mo onset left mid facial numbness and feeling "odd" which she tried to not worry about, but then starting having 5 days onset left nasolabial fold twitching ongoing and persistent.  Reading on [employer] has left her with impression of trigeminal neuralgia, tumor, or stroke. Fortunately denies weakness and pain. No prior hx of MS.  Mother had stroke and this is what she is most concerned about.  Pt denies chest pain, increased sob or doe, wheezing, orthopnea, PND, increased LE swelling, palpitations, dizziness or syncope.  Pt denies other new neurological symptoms such as new headache, or extremity weakness or numbness.   Pt denies polydipsia, polyuria. Past Medical History:  Diagnosis Date  . Allergy   . Hyperlipidemia    No past surgical history on file.  reports that she has never smoked. She has never used smokeless tobacco. She reports that she does not drink alcohol or use drugs. family history includes Cancer in her maternal grandfather and maternal grandmother; Cataracts in her brother, brother, and brother; Diabetes in her mother; Hyperlipidemia in her mother; Hypertension in her mother; Hyperthyroidism in her mother; Hypothyroidism in her sister; Osteoporosis in her mother. No Known Allergies This SmartLink has not been configured with any valid records.   Current Outpatient Prescriptions on File Prior to Visit  Medication Sig Dispense Refill  . mometasone (NASONEX) 50 MCG/ACT nasal spray INSTILL 2 SPRAYS INTO THE NOSE DAILY 17 g 11  . olopatadine (PATANOL) 0.1 % ophthalmic solution INSTILL 1 DROP INTO BOTH EYES 2 TIMES DAILY. 5 mL 12  . simvastatin (ZOCOR) 20 MG tablet TAKE 1 TABLET BY MOUTH ONCE DAILY FOR CHOLESTEROL MANAGEMENT 90 tablet 0  . trimethoprim-polymyxin b (POLYTRIM) ophthalmic solution Place 1 drop into the right eye every 4 (four) hours. For 5 days 10 mL 0    No current facility-administered medications on file prior to visit.    Review of Systems  Constitutional: Negative for unusual diaphoresis or night sweats HENT: Negative for ear swelling or discharge Eyes: Negative for worsening visual haziness  Respiratory: Negative for choking and stridor.   Gastrointestinal: Negative for distension or worsening eructation Genitourinary: Negative for retention or change in urine volume.  Musculoskeletal: Negative for other MSK pain or swelling Skin: Negative for color change and worsening wound Neurological: Negative for tremors and numbness other than noted  Psychiatric/Behavioral: Negative for decreased concentration or agitation other than above   All other system neg per pt    Objective:   Physical Exam BP 120/72   Pulse 86   Temp 98.4 F (36.9 C) (Oral)   Resp 20   Wt 113 lb (51.3 kg)   SpO2 98%   BMI 22.07 kg/m  VS noted,  Constitutional: Pt appears in no apparent distress HENT: Head: NCAT.  Right Ear: External ear normal.  Left Ear: External ear normal.  Eyes: . Pupils are equal, round, and reactive to light. Conjunctivae and EOM are normal Neck: Normal range of motion. Neck supple.  Cardiovascular: Normal rate and regular rhythm.   Pulmonary/Chest: Effort normal and breath sounds without rales or wheezing.  Abd:  Soft, NT, ND, + BS Neurological: Pt is alert. Not confused , cn 2-12 intact except for ? Mild decreased sens to left mid facial, motor 5/5 intact., sens intact to LT, DTR symmetric; + left nasolabial fold area fasciculations  recurring is noted Skin: Skin is warm. No rash, no LE edema Psychiatric: Pt behavior is normal. No agitation.  No other new exam findings    Assessment & Plan:

## 2016-02-21 NOTE — Assessment & Plan Note (Signed)
Lab Results  Component Value Date   LDLCALC 107 (H) 10/19/2015   Stable, cont statin, for improved diet as well

## 2016-02-21 NOTE — Patient Instructions (Addendum)
Please continue all other medications as before, and refills have been done if requested.  Please have the pharmacy call with any other refills you may need.  Please keep your appointments with your specialists as you may have planned  You will be contacted regarding the referral for: MRI, and Neurology referral

## 2016-02-21 NOTE — Assessment & Plan Note (Signed)
Etiology unclear, suspect may be related to peripheral 5th nerve irritation, no specific tx needed at this time, refer neurology per pt request

## 2016-02-21 NOTE — Assessment & Plan Note (Addendum)
Ongoing peristent > 3 mo, Also possibly related I think to peripheral left mid 5th nerve irritation, but cant r/o central disorder, for Head MRI, and neurology referral, consider add asa 81 but declines for now, cont statin

## 2016-03-12 ENCOUNTER — Encounter: Payer: Self-pay | Admitting: Internal Medicine

## 2016-03-24 ENCOUNTER — Other Ambulatory Visit: Payer: 59

## 2016-05-07 ENCOUNTER — Other Ambulatory Visit: Payer: Self-pay | Admitting: Internal Medicine

## 2016-05-07 MED FILL — SIMVASTATIN 20 MG TABLET: 20 | 90 days supply | Qty: 90 | Fill #0

## 2016-05-14 ENCOUNTER — Ambulatory Visit: Payer: 59 | Admitting: Neurology

## 2016-05-28 ENCOUNTER — Encounter: Payer: Self-pay | Admitting: Physician Assistant

## 2016-06-01 ENCOUNTER — Ambulatory Visit (INDEPENDENT_AMBULATORY_CARE_PROVIDER_SITE_OTHER): Payer: 59 | Admitting: Physician Assistant

## 2016-06-01 ENCOUNTER — Encounter: Payer: Self-pay | Admitting: Physician Assistant

## 2016-06-01 ENCOUNTER — Other Ambulatory Visit (INDEPENDENT_AMBULATORY_CARE_PROVIDER_SITE_OTHER): Payer: 59

## 2016-06-01 ENCOUNTER — Ambulatory Visit (INDEPENDENT_AMBULATORY_CARE_PROVIDER_SITE_OTHER)
Admission: RE | Admit: 2016-06-01 | Discharge: 2016-06-01 | Disposition: A | Discharge status: Home / Self Care | Payer: 59 | Source: Ambulatory Visit | Attending: Physician Assistant | Admitting: Physician Assistant

## 2016-06-01 VITALS — BP 90/58 | HR 70 | Ht 60.0 in | Wt 114.0 lb

## 2016-06-01 DIAGNOSIS — R1011 Right upper quadrant pain: Secondary | ICD-10-CM

## 2016-06-01 DIAGNOSIS — R079 Chest pain, unspecified: Secondary | ICD-10-CM

## 2016-06-01 LAB — CBC WITH DIFFERENTIAL/PLATELET
BASOS ABS: 0 10*3/uL (ref 0.0–0.1)
Basophils Relative: 0.4 % (ref 0.0–3.0)
Eosinophils Absolute: 0.2 10*3/uL (ref 0.0–0.7)
Eosinophils Relative: 2.8 % (ref 0.0–5.0)
HCT: 38.3 % (ref 36.0–46.0)
Hemoglobin: 13 g/dL (ref 12.0–15.0)
LYMPHS ABS: 2.4 10*3/uL (ref 0.7–4.0)
LYMPHS PCT: 38.3 % (ref 12.0–46.0)
MCHC: 34 g/dL (ref 30.0–36.0)
MCV: 84.6 fl (ref 78.0–100.0)
MONOS PCT: 8.7 % (ref 3.0–12.0)
Monocytes Absolute: 0.5 10*3/uL (ref 0.1–1.0)
NEUTROS PCT: 49.8 % (ref 43.0–77.0)
Neutro Abs: 3.1 10*3/uL (ref 1.4–7.7)
Platelets: 323 10*3/uL (ref 150.0–400.0)
RBC: 4.53 Mil/uL (ref 3.87–5.11)
RDW: 13.2 % (ref 11.5–15.5)
WBC: 6.2 10*3/uL (ref 4.0–10.5)

## 2016-06-01 LAB — COMPREHENSIVE METABOLIC PANEL
ALK PHOS: 73 U/L (ref 39–117)
ALT: 12 U/L (ref 0–35)
AST: 14 U/L (ref 0–37)
Albumin: 4.5 g/dL (ref 3.5–5.2)
BUN: 15 mg/dL (ref 6–23)
CO2: 29 mEq/L (ref 19–32)
Calcium: 10.1 mg/dL (ref 8.4–10.5)
Chloride: 103 mEq/L (ref 96–112)
Creatinine, Ser: 0.7 mg/dL (ref 0.40–1.20)
GFR: 93.76 mL/min (ref >60.00)
GLUCOSE: 96 mg/dL (ref 70–99)
Potassium: 4.3 mEq/L (ref 3.5–5.1)
Sodium: 138 mEq/L (ref 135–145)
Total Bilirubin: 0.6 mg/dL (ref 0.2–1.2)
Total Protein: 7.9 g/dL (ref 6.0–8.3)

## 2016-06-01 NOTE — Progress Notes (Signed)
Subjective:    Patient ID: Lindsey Boyd, female    DOB: 1965/06/15, 51 y.o.   MRN: [id number]  HPI Lindsey Boyd is a pleasant 51 year old Asian female, new to GI today. She has no previous GI history. Her PCP is Dr. Cathlean Cower. She self referred today for 2 week history of right upper quadrant pain. She says she has never had this pain previously. She had been sick with cold-type symptoms and a cough a few weeks back and said she had coughed quite a bit but her cold had resolved and then she noted right upper quadrant pain. She has not had any radiation or migration of her pain. She describes it as being tender to touch over the right upper quadrant and around into her ribs on the right, pain is aggravated by movement. She does not feel that she has had any injury, no recent weight lifting etc. No fever or chills. No nausea or vomiting, no change with by mouth intake and appetite has been fine. Bowel movements have been normal Family history negative for colon cancer and polyps does have a grandfather who had cirrhosis.  Review of Systems Pertinent positive and negative review of systems were noted in the above HPI section.  All other review of systems was otherwise negative.  Outpatient Encounter Prescriptions as of 06/01/2016  Medication Sig  . mometasone (NASONEX) 50 MCG/ACT nasal spray INSTILL 2 SPRAYS INTO THE NOSE DAILY  . olopatadine (PATANOL) 0.1 % ophthalmic solution INSTILL 1 DROP INTO BOTH EYES 2 TIMES DAILY.  . simvastatin (ZOCOR) 20 MG tablet TAKE 1 TABLET BY MOUTH ONCE DAILY FOR CHOLESTEROL MANAGEMENT  . trimethoprim-polymyxin b (POLYTRIM) ophthalmic solution Place 1 drop into the right eye every 4 (four) hours. For 5 days   No facility-administered encounter medications on file as of 06/01/2016.    No Known Allergies Patient Active Problem List   Diagnosis Date Noted  . Left facial numbness 02/21/2016  . Facial twitching 02/21/2016  . Eustachian tube dysfunction 10/22/2015  . Eye drainage  10/22/2015  . Pulsatile tinnitus of left ear 02/24/2014  . Occipital headache 02/24/2014  . Encounter for well adult exam with abnormal findings 01/06/2013  . Hyperlipidemia 07/15/2008  . TINNITUS, CHRONIC, LEFT 07/15/2008  . ALLERGIC RHINITIS, SEASONAL 07/15/2008  . TB SKIN TEST, POSITIVE 07/15/2008   Social History   Social History  . Marital status: Married    Spouse name: N/A  . Number of children: 2  . Years of education: 20   Occupational History  . systems analyst [employer]   Social History Main Topics  . Smoking status: Never Smoker  . Smokeless tobacco: Never Used  . Alcohol use No  . Drug use: No  . Sexual activity: Yes    Partners: Male   Other Topics Concern  . 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 Married '96. 2 sons '00, '05. Work [employer]. No history of abuse. SO- [employer] Marriage in good health. Discussed ACP referred to [email or web address]    Ms. Flagg's family history includes Cancer in her maternal grandfather and maternal grandmother; Cataracts in her brother, brother, and brother; Diabetes in her mother; Healthy in her father and sister; Hyperlipidemia in her mother; Hypertension in her mother; Hyperthyroidism in her mother; Hypothyroidism in her sister; Osteoporosis in her mother.      Objective:    Vitals:   06/01/16  0849  BP: (!) 90/58  Pulse: 70    Physical Exam  well-developed healthy-appearing middle-aged Asian female in no acute distress, blood pressure 90/58, pulse 70, height 5 foot weight 114, BMI 22.2. HEENT ;nontraumatic normocephalic EOMI PERRLA sclera anicteric, Cardiovascular; regular rate and rhythm with S1-S2 no murmur or gallop, Pulmonary; clear bilaterally, Abdomen ;soft, bowel sounds are present and palpable mass or hepatosplenomegaly she has some mild tenderness in the right upper quadrant with  deep palpation, she is also tender laterally over the lower ribs on the right, Rectal ;exam not done, Ext; no clubbing cyanosis or edema skin warm and dry, Neuropsych; mood and affect appropriate       Assessment & Plan:   #15 51 year old Asian female with 2 week history of right upper quadrant pain. I suspect her pain is of musculoskeletal etiology, she may have some costochondritis, doubt primary GI etiology but cannot rule out  #2 colon cancer surveillance-no previous screening, average risk  Plan; CBC with differential, CMET We will check chest x-ray PA and lateral Schedule for upper abdominal ultrasound  We discussed colon cancer screening, patient says she will call back later in the year 2 schedule screening colonoscopy with Dr. Ardis Hughs with whom  she will be established.   Ameisha Mcclellan S Kinberly Perris PA-C 06/01/2016   Cc: Biagio Borg, MD

## 2016-06-01 NOTE — Patient Instructions (Addendum)
Your physician has requested that you go to the basement for the following lab work before leaving today: CBC/diff, CMET  Make an appointment later this year with Dr Ardis Hughs for a direct colonoscopy.   Today please go get a chest x-ray in our basement before leaving.   You have been scheduled for an abdominal ultrasound at [facility] (1st floor of hospital) on 06/08/16 at 8:30AM. Please arrive 15 minutes prior to your appointment for registration. Make certain not to have anything to eat or drink 6 hours prior to your appointment. Should you need to reschedule your appointment, please contact radiology at [phone]. This test typically takes about 30 minutes to perform.   I appreciate the opportunity to care for you.

## 2016-06-01 NOTE — Progress Notes (Signed)
I agree with the above note, plan 

## 2016-06-08 ENCOUNTER — Ambulatory Visit (HOSPITAL_COMMUNITY): Payer: 59

## 2016-08-29 MED FILL — SIMVASTATIN 20 MG TABLET: 20 | 90 days supply | Qty: 90 | Fill #1

## 2016-10-17 ENCOUNTER — Other Ambulatory Visit: Payer: Self-pay | Admitting: Internal Medicine

## 2016-10-17 DIAGNOSIS — H5213 Myopia, bilateral: Secondary | ICD-10-CM | POA: Diagnosis not present

## 2016-10-17 DIAGNOSIS — H524 Presbyopia: Secondary | ICD-10-CM | POA: Diagnosis not present

## 2016-10-17 MED FILL — MOMETASONE FUROATE 50 MCG S: 50 | 30 days supply | Qty: 17 | Fill #0

## 2016-10-17 MED FILL — OLOPATADINE HCL 0.1 % SOLN: 0.1 | 25 days supply | Qty: 5 | Fill #0

## 2016-10-23 ENCOUNTER — Other Ambulatory Visit (INDEPENDENT_AMBULATORY_CARE_PROVIDER_SITE_OTHER): Payer: 59

## 2016-10-23 ENCOUNTER — Ambulatory Visit (INDEPENDENT_AMBULATORY_CARE_PROVIDER_SITE_OTHER): Payer: 59 | Admitting: Internal Medicine

## 2016-10-23 ENCOUNTER — Encounter: Payer: Self-pay | Admitting: Internal Medicine

## 2016-10-23 VITALS — BP 104/70 | HR 68 | Temp 97.7°F | Ht 60.0 in | Wt 116.0 lb

## 2016-10-23 DIAGNOSIS — Z0000 Encounter for general adult medical examination without abnormal findings: Secondary | ICD-10-CM | POA: Diagnosis not present

## 2016-10-23 DIAGNOSIS — L299 Pruritus, unspecified: Secondary | ICD-10-CM

## 2016-10-23 DIAGNOSIS — G514 Facial myokymia: Secondary | ICD-10-CM

## 2016-10-23 DIAGNOSIS — Z114 Encounter for screening for human immunodeficiency virus [HIV]: Secondary | ICD-10-CM

## 2016-10-23 LAB — URINALYSIS, ROUTINE W REFLEX MICROSCOPIC
Bilirubin Urine: NEGATIVE
KETONES UR: NEGATIVE
Nitrite: NEGATIVE
PH: 7.5 (ref 5.0–8.0)
SPECIFIC GRAVITY, URINE: 1.01 (ref 1.000–1.030)
Total Protein, Urine: NEGATIVE
UROBILINOGEN UA: 0.2 (ref 0.0–1.0)
Urine Glucose: NEGATIVE

## 2016-10-23 LAB — BASIC METABOLIC PANEL
BUN: 15 mg/dL (ref 6–23)
CO2: 32 mEq/L (ref 19–32)
CREATININE: 0.85 mg/dL (ref 0.40–1.20)
Calcium: 10.1 mg/dL (ref 8.4–10.5)
Chloride: 103 mEq/L (ref 96–112)
GFR: 74.82 mL/min (ref >60.00)
Glucose, Bld: 105 mg/dL — ABNORMAL HIGH (ref 70–99)
POTASSIUM: 4.6 meq/L (ref 3.5–5.1)
Sodium: 140 mEq/L (ref 135–145)

## 2016-10-23 LAB — CBC WITH DIFFERENTIAL/PLATELET
BASOS ABS: 0 10*3/uL (ref 0.0–0.1)
BASOS PCT: 0.4 % (ref 0.0–3.0)
EOS PCT: 2.2 % (ref 0.0–5.0)
Eosinophils Absolute: 0.2 10*3/uL (ref 0.0–0.7)
HEMATOCRIT: 38.6 % (ref 36.0–46.0)
Hemoglobin: 12.9 g/dL (ref 12.0–15.0)
LYMPHS ABS: 2.2 10*3/uL (ref 0.7–4.0)
Lymphocytes Relative: 27.6 % (ref 12.0–46.0)
MCHC: 33.4 g/dL (ref 30.0–36.0)
MCV: 85.4 fl (ref 78.0–100.0)
MONOS PCT: 8.2 % (ref 3.0–12.0)
Monocytes Absolute: 0.6 10*3/uL (ref 0.1–1.0)
NEUTROS ABS: 4.8 10*3/uL (ref 1.4–7.7)
NEUTROS PCT: 61.6 % (ref 43.0–77.0)
PLATELETS: 312 10*3/uL (ref 150.0–400.0)
RBC: 4.52 Mil/uL (ref 3.87–5.11)
RDW: 13.5 % (ref 11.5–15.5)
WBC: 7.8 10*3/uL (ref 4.0–10.5)

## 2016-10-23 LAB — LIPID PANEL
CHOL/HDL RATIO: 3
Cholesterol: 206 mg/dL — ABNORMAL HIGH (ref 0–200)
HDL: 62.8 mg/dL (ref >39.00)
LDL Cholesterol: 117 mg/dL — ABNORMAL HIGH (ref 0–99)
NONHDL: 142.95
TRIGLYCERIDES: 132 mg/dL (ref 0.0–149.0)
VLDL: 26.4 mg/dL (ref 0.0–40.0)

## 2016-10-23 LAB — HEPATIC FUNCTION PANEL
ALT: 12 U/L (ref 0–35)
AST: 16 U/L (ref 0–37)
Albumin: 4.8 g/dL (ref 3.5–5.2)
Alkaline Phosphatase: 74 U/L (ref 39–117)
BILIRUBIN DIRECT: 0.1 mg/dL (ref 0.0–0.3)
Total Bilirubin: 0.4 mg/dL (ref 0.2–1.2)
Total Protein: 8.3 g/dL (ref 6.0–8.3)

## 2016-10-23 LAB — TSH: TSH: 0.97 u[IU]/mL (ref 0.35–4.50)

## 2016-10-23 MED ORDER — POLYMYXIN B-TRIMETHOPRIM 10000-0.1 UNIT/ML-% OP SOLN: 1.0000 [drp] | OPHTHALMIC | 0 refills | Status: DC

## 2016-10-23 MED ORDER — MOMETASONE FUROATE 50 MCG/ACT NA SUSP: NASAL | 11 refills | Status: DC

## 2016-10-23 MED ORDER — SIMVASTATIN 20 MG PO TABS: ORAL_TABLET | ORAL | 3 refills | Status: DC

## 2016-10-23 MED ORDER — OLOPATADINE HCL 0.1 % OP SOLN: OPHTHALMIC | 12 refills | Status: DC

## 2016-10-23 NOTE — Assessment & Plan Note (Signed)
Mild intermitent without rash, ? Neuritis vs other, overall mild, exam benign, pt to call if wants derm or neurology referral, consider topical capsaiin

## 2016-10-23 NOTE — Patient Instructions (Addendum)

## 2016-10-23 NOTE — Progress Notes (Signed)
Subjective:    Patient ID: Lindsey Boyd, female    DOB: 02/15/1965, 51 y.o.   MRN: [id number]  HPI  Here for wellness and f/u;  Overall doing ok;  Pt denies Chest pain, worsening SOB, DOE, wheezing, orthopnea, PND, worsening LE edema, palpitations, dizziness or syncope.  Pt denies neurological change such as new headache, facial or extremity weakness.  Pt denies polydipsia, polyuria, or low sugar symptoms. Pt states overall good compliance with treatment and medications, good tolerability, and has been trying to follow appropriate diet.  Pt denies worsening depressive symptoms, suicidal ideation or panic. No fever, night sweats, wt loss, loss of appetite, or other constitutional symptoms.  Pt states good ability with ADL's, has low fall risk, home safety reviewed and adequate, no other significant changes in hearing or vision, and occasionally active with exercise. Conts to work for [employer] system in [place]. No complaints, no other interval hx except for what sounds like left facial fasciculations, and bilat forearm itching without rash or swelling, just feels "deep" denies neck or other radicular pain Wt Readings from Last 3 Encounters:  10/23/16 116 lb (52.6 kg)  06/01/16 114 lb (51.7 kg)  02/21/16 113 lb (51.3 kg)  \ BP Readings from Last 3 Encounters:  10/23/16 104/70  06/01/16 (!) 90/58  02/21/16 120/72   Past Medical History:  Diagnosis Date  . Allergy   . Hyperlipidemia    Past Surgical History:  Procedure Laterality Date  . None      reports that she has never smoked. She has never used smokeless tobacco. She reports that she does not drink alcohol or use drugs. family history includes Cancer in her maternal grandfather and maternal grandmother; Cataracts in her brother, brother, and brother; Diabetes in her mother; Healthy in her father and sister; Hyperlipidemia in her mother; Hypertension in her mother; Hyperthyroidism in her mother; Hypothyroidism in her sister; Osteoporosis in her  mother. No Known Allergies Current Outpatient Prescriptions on File Prior to Visit  Medication Sig Dispense Refill  . mometasone (NASONEX) 50 MCG/ACT nasal spray INSTILL 2 SPRAYS INTO THE NOSE DAILY 17 g 11  . olopatadine (PATANOL) 0.1 % ophthalmic solution INSTILL 1 DROP INTO BOTH EYES 2 TIMES DAILY. 5 mL 12  . simvastatin (ZOCOR) 20 MG tablet TAKE 1 TABLET BY MOUTH ONCE DAILY FOR CHOLESTEROL MANAGEMENT 90 tablet 1  . trimethoprim-polymyxin b (POLYTRIM) ophthalmic solution Place 1 drop into the right eye every 4 (four) hours. For 5 days 10 mL 0   No current facility-administered medications on file prior to visit.     Review of Systems Constitutional: Negative for other unusual diaphoresis, sweats, appetite or weight changes HENT: Negative for other worsening hearing loss, ear pain, facial swelling, mouth sores or neck stiffness.   Eyes: Negative for other worsening pain, redness or other visual disturbance.  Respiratory: Negative for other stridor or swelling Cardiovascular: Negative for other palpitations or other chest pain  Gastrointestinal: Negative for worsening diarrhea or loose stools, blood in stool, distention or other pain Genitourinary: Negative for hematuria, flank pain or other change in urine volume.  Musculoskeletal: Negative for myalgias or other joint swelling.  Skin: Negative for other color change, or other wound or worsening drainage.  Neurological: Negative for other syncope or numbness. Hematological: Negative for other adenopathy or swelling Psychiatric/Behavioral: Negative for hallucinations, other worsening agitation, SI, self-injury, or new decreased concentration All other system neg per pt    Objective:   Physical Exam BP 104/70  Pulse 68   Temp 97.7 F (36.5 C) (Oral)   Ht 5' (1.524 m)   Wt 116 lb (52.6 kg)   SpO2 100%   BMI 22.65 kg/m  VS noted,  Constitutional: Pt is oriented to person, place, and time. Appears well-developed and  well-nourished, in no significant distress and comfortable Head: Normocephalic and atraumatic  Eyes: Conjunctivae and EOM are normal. Pupils are equal, round, and reactive to light Right Ear: External ear normal without discharge Left Ear: External ear normal without discharge Nose: Nose without discharge or deformity Mouth/Throat: Oropharynx is without other ulcerations and moist  Neck: Normal range of motion. Neck supple. No JVD present. No tracheal deviation present or significant neck LA or mass Cardiovascular: Normal rate, regular rhythm, normal heart sounds and intact distal pulses.   Pulmonary/Chest: WOB normal and breath sounds without rales or wheezing  Abdominal: Soft. Bowel sounds are normal. NT. No HSM  Musculoskeletal: Normal range of motion. Exhibits no edema Lymphadenopathy: Has no other cervical adenopathy.  Neurological: Pt is alert and oriented to person, place, and time. Pt has normal reflexes. No cranial nerve deficit. Motor grossly intact, Gait intact Skin: Skin is warm and dry. No rash noted or new ulcerations Psychiatric:  Has normal mood and affect. Behavior is normal without agitation No other exam findings Lab Results  Component Value Date   WBC 6.2 06/01/2016   HGB 13.0 06/01/2016   HCT 38.3 06/01/2016   PLT 323.0 06/01/2016   GLUCOSE 96 06/01/2016   CHOL 200 10/19/2015   TRIG 135.0 10/19/2015   HDL 66.90 10/19/2015   LDLDIRECT 159.0 10/21/2012   LDLCALC 107 (H) 10/19/2015   ALT 12 06/01/2016   AST 14 06/01/2016   NA 138 06/01/2016   K 4.3 06/01/2016   CL 103 06/01/2016   CREATININE 0.70 06/01/2016   BUN 15 06/01/2016   CO2 29 06/01/2016   TSH 0.65 10/19/2015      Assessment & Plan:

## 2016-10-23 NOTE — Assessment & Plan Note (Signed)
C/w liekly fasculation, ok to follow for any worsening, to check labs today as ordered

## 2016-10-23 NOTE — Assessment & Plan Note (Signed)

## 2016-10-24 LAB — HIV ANTIBODY (ROUTINE TESTING W REFLEX): HIV 1&2 Ab, 4th Generation: NONREACTIVE

## 2016-10-26 ENCOUNTER — Encounter: Payer: Self-pay | Admitting: Internal Medicine

## 2016-12-05 MED FILL — SIMVASTATIN 20 MG TABLET: 20 | 90 days supply | Qty: 90 | Fill #0

## 2017-03-15 MED FILL — SIMVASTATIN 20 MG TABS: 20 | 90 days supply | Qty: 90 | Fill #1

## 2017-04-19 MED FILL — MOMETASONE FUROATE 50 MCG S: 50 | 30 days supply | Qty: 17 | Fill #1

## 2017-05-27 MED FILL — MOMETASONE FUROATE 50 MCG S: 50 | 30 days supply | Qty: 17 | Fill #2

## 2017-05-27 MED FILL — OLOPATADINE HCL 0.1% EYE DR: 0.1 | 25 days supply | Qty: 5 | Fill #1

## 2017-06-12 MED FILL — SIMVASTATIN 20 MG TABS: 20 | 90 days supply | Qty: 90 | Fill #2

## 2017-06-13 ENCOUNTER — Telehealth: Payer: Self-pay | Admitting: Physician Assistant

## 2017-06-13 NOTE — Telephone Encounter (Signed)
Pt is requesting a soon appt. She has been having loose stools for the last few days and today she noticed blood in her stool and is very concerned. Plc call pt.

## 2017-06-13 NOTE — Telephone Encounter (Signed)
Spoke with the patient. She has had 1 bowel movement for 2 days in a row that has been loose. She has seen blood on the tissue when she cleans. She does not see blood in the bowel movement because she "did not look." Denies fever pain and nausea. She is very concerned though because "this has never happened and I need to see the doctor."  Patient of Dr Ardis Hughs. She is 52 years and has never had a screening colonoscopy.  Appointment scheduled for evaluation.

## 2017-06-17 ENCOUNTER — Encounter: Payer: Self-pay | Admitting: Physician Assistant

## 2017-06-17 ENCOUNTER — Ambulatory Visit: Payer: 59 | Admitting: Physician Assistant

## 2017-06-17 VITALS — BP 102/58 | HR 64 | Ht 60.0 in | Wt 113.0 lb

## 2017-06-17 DIAGNOSIS — Z1211 Encounter for screening for malignant neoplasm of colon: Secondary | ICD-10-CM

## 2017-06-17 DIAGNOSIS — K625 Hemorrhage of anus and rectum: Secondary | ICD-10-CM

## 2017-06-17 MED ORDER — PEG 3350-KCL-NABCB-NACL-NASULF 236 G PO SOLR: ORAL | 0 refills | Status: DC

## 2017-06-17 MED FILL — PEG-3350 AND ELECTROLYTES S: 236 | 2 days supply | Qty: 4000 | Fill #0

## 2017-06-17 NOTE — Progress Notes (Signed)
Subjective:    Patient ID: Lindsey Boyd, female    DOB: [birth year], 52 y.o.   MRN: [id number]  HPI Lindsey Boyd is a pleasant 52 year old female, last seen in GI about a year ago and established with Dr. Ardis Hughs.  She had been seen for right upper quadrant pain which may have been musculoskeletal and resolved. She is generally in good health with history of tinnitus and hyperlipidemia. She comes in today because of an episode of rectal bleeding which occurred last week.  She says she had been feeling fine but had a couple of loose bowel movements last Thursday and noted bright red blood on the tissue.  She says she panicked and did not actually look at the stool to see if there was blood in the commode or mixed in.  She had no rectal discomfort or hemorrhoidal type symptoms.  She has had no associated abdominal discomfort or cramping.  Her bowel movements have returned to normal over the past couple of days and she has not noticed any further bleeding. History is negative for colon cancer, positive for colon polyps in patient's mother. Patient has not had any prior colonoscopic evaluation.  Review of Systems Pertinent positive and negative review of systems were noted in the above HPI section.  All other review of systems was otherwise negative.  Outpatient Encounter Medications as of 06/17/2017  Medication Sig  . mometasone (NASONEX) 50 MCG/ACT nasal spray INSTILL 2 SPRAYS INTO THE NOSE DAILY  . olopatadine (PATANOL) 0.1 % ophthalmic solution INSTILL 1 DROP INTO BOTH EYES 2 TIMES DAILY.  . simvastatin (ZOCOR) 20 MG tablet TAKE 1 TABLET BY MOUTH ONCE DAILY FOR CHOLESTEROL MANAGEMENT  . trimethoprim-polymyxin b (POLYTRIM) ophthalmic solution Place 1 drop into the right eye every 4 (four) hours. For 5 days  . polyethylene glycol (GOLYTELY) 236 g solution Take as directed for colonoscopy.   No facility-administered encounter medications on file as of 06/17/2017.    No Known Allergies Patient Active Problem  List   Diagnosis Date Noted  . Itching 10/23/2016  . Facial twitching 02/21/2016  . Eustachian tube dysfunction 10/22/2015  . Eye drainage 10/22/2015  . Pulsatile tinnitus of left ear 02/24/2014  . Occipital headache 02/24/2014  . Preventative health care 01/06/2013  . Hyperlipidemia 07/15/2008  . TINNITUS, CHRONIC, LEFT 07/15/2008  . ALLERGIC RHINITIS, SEASONAL 07/15/2008  . TB SKIN TEST, POSITIVE 07/15/2008   Social History   Socioeconomic History  . Marital status: Married    Spouse name: Not on file  . Number of children: 2  . Years of education: 39  . Highest education level: Not on file  Occupational History  . Occupation: Armed forces technical officer: [employer]  Social Needs  . Financial resource strain: Not on file  . Food insecurity:    Worry: Not on file    Inability: Not on file  . Transportation needs:    Medical: Not on file    Non-medical: Not on file  Tobacco Use  . Smoking status: Never Smoker  . Smokeless tobacco: Never Used  Substance and Sexual Activity  . Alcohol use: No  . Drug use: No  . Sexual activity: Yes    Partners: Male  Lifestyle  . Physical activity:    Days per week: Not on file    Minutes per session: Not on file  . Stress: Not on file  Relationships  . Social connections:    Talks on phone: Not on file  Gets together: Not on file    Attends religious service: Not on file    Active member of club or organization: Not on file    Attends meetings of clubs or organizations: Not on file    Relationship status: Not on file  . Intimate partner violence:    Fear of current or ex partner: Not on file    Emotionally abused: Not on file    Physically abused: Not on file    Forced sexual activity: Not on file  Other Topics Concern  . 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 Married '96. 2 sons '00, '05. Work [employer]. No  history of abuse. SO- [employer] Marriage in good health. Discussed ACP referred to [email or web address]    Lindsey Boyd's family history includes Cancer in her maternal grandfather and maternal grandmother; Cataracts in her brother, brother, and brother; Diabetes in her mother; Healthy in her father and sister; Hyperlipidemia in her mother; Hypertension in her mother; Hyperthyroidism in her mother; Hypothyroidism in her sister; Osteoporosis in her mother.      Objective:    Vitals:   06/17/17 1426  BP: (!) 102/58  Pulse: 64    Physical Exam; well-developed Asian female in no acute distress, pleasant blood pressure 102/58 pulse 64, height 5 foot, weight 113, BMI of 22.0.  HEENT ;nontraumatic normocephalic EOMI PERRLA sclera anicteric, Oropharynx clear, Cardiovascular; regular rate and rhythm with S1-S2 no murmur rub or gallop, Pulmonary; clear bilaterally, Abdomen; soft, nontender nondistended bowel sounds are active there is no palpable mass or hepatosplenomegaly.  Rectal ;exam not done, Extremities ;no clubbing cyanosis or edema skin warm and dry, Neuro psych; alert and oriented, grossly nonfocal mood and affect appropriate       Assessment & Plan:   #31 52 year old Asian female with recent isolated episode of rectal bleeding nauseated with loose stools which have since resolved. Etiology of bleeding is not clear, this may have been secondary to local anal rectal irritation, internal hemorrhoid, or occult lesion. #2 colon cancer surveillance-no prior colonoscopy #3 hyperlipidemia  Plan; Patient will be scheduled for colonoscopy with Dr. Ardis Hughs.  Procedure was discussed in detail with the patient including indications risks and benefits and she is agreeable to proceed.  Kaiven Vester S Lera Gaines PA-C 06/17/2017   Cc: Lindsey Borg, MD

## 2017-06-17 NOTE — Patient Instructions (Signed)

## 2017-06-18 NOTE — Progress Notes (Signed)
I agree with the above note, plan 

## 2017-07-02 ENCOUNTER — Other Ambulatory Visit: Payer: Self-pay

## 2017-07-02 ENCOUNTER — Encounter: Payer: Self-pay | Admitting: Gastroenterology

## 2017-07-02 ENCOUNTER — Ambulatory Visit (AMBULATORY_SURGERY_CENTER): Payer: 59 | Admitting: Gastroenterology

## 2017-07-02 VITALS — BP 101/56 | HR 85 | Temp 97.7°F | Resp 21 | Ht 61.0 in | Wt 113.0 lb

## 2017-07-02 DIAGNOSIS — D122 Benign neoplasm of ascending colon: Secondary | ICD-10-CM

## 2017-07-02 DIAGNOSIS — Z1211 Encounter for screening for malignant neoplasm of colon: Secondary | ICD-10-CM | POA: Diagnosis present

## 2017-07-02 MED ORDER — SODIUM CHLORIDE 0.9 % IV SOLN: 500.0000 mL | Freq: Once | INTRAVENOUS | Status: DC

## 2017-07-02 NOTE — Progress Notes (Signed)
To PACU, VSS. Report to [email or web address] 

## 2017-07-02 NOTE — Op Note (Signed)
[facility] Patient Name: Lindsey Boyd Procedure Date: 07/02/2017 8:20 AM MRN: [id number] Endoscopist: Milus Banister , MD Age: 52 Referring MD:  Date of Birth: 01/30/1965 Gender: Female Account #: [id number] Procedure:                Colonoscopy Indications:              Screening for colorectal malignant neoplasm Medicines:                Monitored Anesthesia Care Procedure:                Pre-Anesthesia Assessment:                           - Prior to the procedure, a History and Physical                            was performed, and patient medications and                            allergies were reviewed. The patient's tolerance of                            previous anesthesia was also reviewed. The risks                            and benefits of the procedure and the sedation                            options and risks were discussed with the patient.                            All questions were answered, and informed consent                            was obtained. Prior Anticoagulants: The patient has                            taken no previous anticoagulant or antiplatelet                            agents. ASA Grade Assessment: II - A patient with                            mild systemic disease. After reviewing the risks                            and benefits, the patient was deemed in                            satisfactory condition to undergo the procedure.                           After obtaining informed consent, the colonoscope  was passed under direct vision. Throughout the                            procedure, the patient's blood pressure, pulse, and                            oxygen saturations were monitored continuously. The                            Model CF-HQ190L [id number]) scope was introduced                            through the anus and advanced to the the cecum,                            identified by  appendiceal orifice and ileocecal                            valve. The colonoscopy was performed without                            difficulty. The patient tolerated the procedure                            well. The quality of the bowel preparation was                            good. The ileocecal valve, appendiceal orifice, and                            rectum were photographed. Scope In: 8:32:26 AM Scope Out: 8:48:58 AM Scope Withdrawal Time: 0 hours 13 minutes 4 seconds  Total Procedure Duration: 0 hours 16 minutes 32 seconds  Findings:                 A 5 mm polyp was found in the ascending colon. The                            polyp was sessile. The polyp was removed with a                            cold snare. Resection and retrieval were complete.                           The exam was otherwise without abnormality on                            direct and retroflexion views. Complications:            No immediate complications. Estimated blood loss:                            None. Estimated Blood Loss:     Estimated blood loss: none. Impression:               -  One 5 mm polyp in the ascending colon, removed                            with a cold snare. Resected and retrieved.                           - The examination was otherwise normal on direct                            and retroflexion views.                           - Your recent mild rectal bleeding was most likely                            from a small hemorrhoid that has since resolved. Recommendation:           - Patient has a contact number available for                            emergencies. The signs and symptoms of potential                            delayed complications were discussed with the                            patient. Return to normal activities tomorrow.                            Written discharge instructions were provided to the                            patient.                            - Resume previous diet.                           - Continue present medications.                           You will receive a letter within 2-3 weeks with the                            pathology results and my final recommendations.                           If the polyp(s) is proven to be 'pre-cancerous' on                            pathology, you will need repeat colonoscopy in 5                            years. If the polyp(s) is NOT 'precancerous' on  pathology then you should repeat colon cancer                            screening in 10 years with colonoscopy without need                            for colon cancer screening by any method prior to                            then (including stool testing). Milus Banister, MD 07/02/2017 8:51:47 AM This report has been signed electronically.

## 2017-07-02 NOTE — Progress Notes (Signed)
Called to room to assist during endoscopic procedure.  Patient ID and intended procedure confirmed with present staff. Received instructions for my participation in the procedure from the performing physician.  

## 2017-07-02 NOTE — Patient Instructions (Signed)

## 2017-07-03 ENCOUNTER — Telehealth: Payer: Self-pay

## 2017-07-03 NOTE — Telephone Encounter (Signed)
  Follow up Call-  Call back number 07/02/2017  Post procedure Call Back phone  # [phone]  Permission to leave phone message Yes  Some recent data might be hidden     Patient questions:  Do you have a fever, pain , or abdominal swelling? No. Pain Score  0 *  Have you tolerated food without any problems? Yes.    Have you been able to return to your normal activities? Yes.    Do you have any questions about your discharge instructions: Diet   No. Medications  No. Follow up visit  No.  Do you have questions or concerns about your Care? No.  Actions: * If pain score is 4 or above: No action needed, pain <4.

## 2017-07-05 ENCOUNTER — Encounter: Payer: Self-pay | Admitting: Gastroenterology

## 2017-09-19 MED FILL — SIMVASTATIN 20 MG TABLET: 20 | 90 days supply | Qty: 90 | Fill #3

## 2017-11-01 ENCOUNTER — Other Ambulatory Visit: Payer: Self-pay | Admitting: Internal Medicine

## 2017-11-11 ENCOUNTER — Other Ambulatory Visit: Payer: Self-pay | Admitting: Internal Medicine

## 2017-11-26 DIAGNOSIS — H52223 Regular astigmatism, bilateral: Secondary | ICD-10-CM | POA: Diagnosis not present

## 2017-11-26 DIAGNOSIS — H5213 Myopia, bilateral: Secondary | ICD-10-CM | POA: Diagnosis not present

## 2017-11-26 DIAGNOSIS — H524 Presbyopia: Secondary | ICD-10-CM | POA: Diagnosis not present

## 2017-12-30 ENCOUNTER — Other Ambulatory Visit: Payer: Self-pay | Admitting: Internal Medicine

## 2018-01-03 ENCOUNTER — Other Ambulatory Visit: Payer: Self-pay | Admitting: Internal Medicine

## 2018-01-14 ENCOUNTER — Other Ambulatory Visit: Payer: Self-pay | Admitting: Internal Medicine

## 2018-01-14 MED ORDER — SIMVASTATIN 20 MG PO TABS: ORAL_TABLET | ORAL | 0 refills | Status: DC

## 2018-01-14 NOTE — Telephone Encounter (Signed)
Per office policy sent 30 day to local pharmacy until appt.../lmb  

## 2018-01-14 NOTE — Telephone Encounter (Signed)
Copied from [facility] [phone]. Topic: Quick Communication - Rx Refill/Question >> Jan 14, 2018  1:27 PM Keene Breath wrote: Medication: simvastatin (ZOCOR) 20 MG tablet  Patient called to request a refill for the above medication  Preferred Pharmacy (with phone number or street name): [place], [place] - [place] [phone] (Phone) [phone] (Fax)

## 2018-01-14 NOTE — Telephone Encounter (Signed)
Requested medication (s) are due for refill today: yes  Requested medication (s) are on the active medication list: yes  Last refill:  10/23/16 for 90 and 3 refills  Future visit scheduled: yes  Notes to clinic:  This prescription expired on 10/23/17. Cardiovascular: Antilipid - Statins Failed.  Requested Prescriptions  Pending Prescriptions Disp Refills   simvastatin (ZOCOR) 20 MG tablet 90 tablet 3    Sig: TAKE 1 TABLET BY MOUTH ONCE DAILY FOR CHOLESTEROL MANAGEMENT     Cardiovascular:  Antilipid - Statins Failed - 01/14/2018  1:35 PM      Failed - Total Cholesterol in normal range and within 360 days    Cholesterol  Date Value Ref Range Status  10/23/2016 206 (H) 0 - 200 mg/dL Final    Comment:    ATP III Classification       Desirable:  < 200 mg/dL               Borderline High:  200 - 239 mg/dL          High:  > = 240 mg/dL         Failed - LDL in normal range and within 360 days    LDL Cholesterol  Date Value Ref Range Status  10/23/2016 117 (H) 0 - 99 mg/dL Final         Failed - HDL in normal range and within 360 days    HDL  Date Value Ref Range Status  10/23/2016 62.80 >39.00 mg/dL Final         Failed - Triglycerides in normal range and within 360 days    Triglycerides  Date Value Ref Range Status  10/23/2016 132.0 0.0 - 149.0 mg/dL Final    Comment:    Normal:  <150 mg/dLBorderline High:  150 - 199 mg/dL         Failed - Valid encounter within last 12 months    Recent Outpatient Visits          1 year ago Preventative health care   [facility] -Georges Mouse, MD   1 year ago Left facial numbness   [facility] -Georges Mouse, MD   2 years ago Encounter for well adult exam with abnormal findings   [place], James W, MD   3 years ago Routine health maintenance   [place], James W, MD   5 years ago Routine health maintenance   [place], Heinz Knuckles, MD      Future Appointments            In 1 month Jenny Reichmann, Hunt Oris, MD [place], [place] - Patient is not pregnant

## 2018-02-03 MED FILL — SIMVASTATIN 20 MG TABLET: 20 | 30 days supply | Qty: 30 | Fill #0

## 2018-02-21 ENCOUNTER — Encounter: Payer: Self-pay | Admitting: Internal Medicine

## 2018-02-21 ENCOUNTER — Ambulatory Visit (INDEPENDENT_AMBULATORY_CARE_PROVIDER_SITE_OTHER): Payer: 59 | Admitting: Internal Medicine

## 2018-02-21 ENCOUNTER — Other Ambulatory Visit: Payer: Self-pay | Admitting: Internal Medicine

## 2018-02-21 ENCOUNTER — Other Ambulatory Visit (INDEPENDENT_AMBULATORY_CARE_PROVIDER_SITE_OTHER): Payer: 59

## 2018-02-21 VITALS — BP 108/72 | HR 84 | Temp 98.4°F | Ht 61.0 in | Wt 116.0 lb

## 2018-02-21 DIAGNOSIS — Z0000 Encounter for general adult medical examination without abnormal findings: Secondary | ICD-10-CM

## 2018-02-21 DIAGNOSIS — Z1239 Encounter for other screening for malignant neoplasm of breast: Secondary | ICD-10-CM

## 2018-02-21 LAB — LIPID PANEL
Cholesterol: 225 mg/dL — ABNORMAL HIGH (ref 0–200)
HDL: 70.8 mg/dL (ref >39.00)
LDL Cholesterol: 134 mg/dL — ABNORMAL HIGH (ref 0–99)
NonHDL: 153.84
Total CHOL/HDL Ratio: 3
Triglycerides: 98 mg/dL (ref 0.0–149.0)
VLDL: 19.6 mg/dL (ref 0.0–40.0)

## 2018-02-21 LAB — CBC WITH DIFFERENTIAL/PLATELET
Basophils Absolute: 0 10*3/uL (ref 0.0–0.1)
Basophils Relative: 0.6 % (ref 0.0–3.0)
Eosinophils Absolute: 0.2 10*3/uL (ref 0.0–0.7)
Eosinophils Relative: 2.3 % (ref 0.0–5.0)
HCT: 39.4 % (ref 36.0–46.0)
HEMOGLOBIN: 13.3 g/dL (ref 12.0–15.0)
Lymphocytes Relative: 28.8 % (ref 12.0–46.0)
Lymphs Abs: 1.9 10*3/uL (ref 0.7–4.0)
MCHC: 33.7 g/dL (ref 30.0–36.0)
MCV: 84 fl (ref 78.0–100.0)
MONO ABS: 0.5 10*3/uL (ref 0.1–1.0)
Monocytes Relative: 7.1 % (ref 3.0–12.0)
Neutro Abs: 4 10*3/uL (ref 1.4–7.7)
Neutrophils Relative %: 61.2 % (ref 43.0–77.0)
Platelets: 333 10*3/uL (ref 150.0–400.0)
RBC: 4.7 Mil/uL (ref 3.87–5.11)
RDW: 13.4 % (ref 11.5–15.5)
WBC: 6.6 10*3/uL (ref 4.0–10.5)

## 2018-02-21 LAB — URINALYSIS, ROUTINE W REFLEX MICROSCOPIC
Bilirubin Urine: NEGATIVE
KETONES UR: NEGATIVE
Nitrite: NEGATIVE
Specific Gravity, Urine: 1.015 (ref 1.000–1.030)
Total Protein, Urine: NEGATIVE
Urine Glucose: NEGATIVE
Urobilinogen, UA: 0.2 (ref 0.0–1.0)
pH: 6.5 (ref 5.0–8.0)

## 2018-02-21 LAB — TSH: TSH: 1.3 u[IU]/mL (ref 0.35–4.50)

## 2018-02-21 LAB — BASIC METABOLIC PANEL
BUN: 17 mg/dL (ref 6–23)
CO2: 27 mEq/L (ref 19–32)
Calcium: 10.3 mg/dL (ref 8.4–10.5)
Chloride: 102 mEq/L (ref 96–112)
Creatinine, Ser: 0.73 mg/dL (ref 0.40–1.20)
GFR: 83.48 mL/min (ref >60.00)
GLUCOSE: 105 mg/dL — AB (ref 70–99)
Potassium: 4.7 mEq/L (ref 3.5–5.1)
Sodium: 141 mEq/L (ref 135–145)

## 2018-02-21 LAB — HEPATIC FUNCTION PANEL
ALT: 15 U/L (ref 0–35)
AST: 16 U/L (ref 0–37)
Albumin: 4.7 g/dL (ref 3.5–5.2)
Alkaline Phosphatase: 70 U/L (ref 39–117)
Bilirubin, Direct: 0.1 mg/dL (ref 0.0–0.3)
Total Bilirubin: 0.4 mg/dL (ref 0.2–1.2)
Total Protein: 7.9 g/dL (ref 6.0–8.3)

## 2018-02-21 MED ORDER — OLOPATADINE HCL 0.1 % OP SOLN: OPHTHALMIC | 12 refills | Status: DC

## 2018-02-21 MED ORDER — ROSUVASTATIN CALCIUM 20 MG PO TABS: 20.0000 mg | ORAL_TABLET | Freq: Every day | ORAL | 3 refills | Status: DC

## 2018-02-21 MED ORDER — MOMETASONE FUROATE 50 MCG/ACT NA SUSP: NASAL | 11 refills | Status: DC

## 2018-02-21 MED ORDER — SIMVASTATIN 20 MG PO TABS: ORAL_TABLET | ORAL | 3 refills | Status: DC

## 2018-02-21 MED FILL — MOMETASONE FUROATE 50 MCG S: 50 | 30 days supply | Qty: 17 | Fill #0

## 2018-02-21 MED FILL — ROSUVASTATIN CALCIUM 20 MG: 20 | 90 days supply | Qty: 90 | Fill #0

## 2018-02-21 MED FILL — OLOPATADINE HCL 0.1% EYE DR: 0.1 | 25 days supply | Qty: 5 | Fill #0

## 2018-02-21 NOTE — Assessment & Plan Note (Signed)

## 2018-02-21 NOTE — Patient Instructions (Addendum)
Please check if your insurance covers the [facility], but if not, please consider calling for an appt with [facility].  You will be contacted regarding the referral for: mammogram  Please continue all other medications as before, and refills have been done if requested.  Please have the pharmacy call with any other refills you may need.  Please continue your efforts at being more active, low cholesterol diet, and weight control.  You are otherwise up to date with prevention measures today.  Please keep your appointments with your specialists as you may have planned  Please go to the LAB in the Basement (turn left off the elevator) for the tests to be done today  You will be contacted by phone if any changes need to be made immediately.  Otherwise, you will receive a letter about your results with an explanation, but please check with MyChart first.  Please remember to sign up for MyChart if you have not done so, as this will be important to you in the future with finding out test results, communicating by private email, and scheduling acute appointments online when needed.  Please return in 1 year for your yearly visit, or sooner if needed, with Lab testing done 3-5 days before

## 2018-02-21 NOTE — Progress Notes (Signed)
Subjective:    Patient ID: Lindsey Boyd, female    DOB: 12-21-1965, 53 y.o.   MRN: [id number]  HPI  Here for wellness and f/u;  Overall doing ok;  Pt denies Chest pain, worsening SOB, DOE, wheezing, orthopnea, PND, worsening LE edema, palpitations, dizziness or syncope.  Pt denies neurological change such as new headache, facial or extremity weakness.  Pt denies polydipsia, polyuria, or low sugar symptoms. Pt states overall good compliance with treatment and medications, good tolerability, and has been trying to follow appropriate diet.  Pt denies worsening depressive symptoms, suicidal ideation or panic. No fever, night sweats, wt loss, loss of appetite, or other constitutional symptoms.  Pt states good ability with ADL's, has low fall risk, home safety reviewed and adequate, no other significant changes in hearing or vision, and only occasionally active with exercise.  No new complaints. Denies urinary symptoms such as dysuria, frequency, urgency, flank pain, hematuria or n/v, fever, chills. Past Medical History:  Diagnosis Date  . Allergy   . Hyperlipidemia    Past Surgical History:  Procedure Laterality Date  . None      reports that she has never smoked. She has never used smokeless tobacco. She reports that she does not drink alcohol or use drugs. family history includes Breast cancer in her maternal grandmother; Cancer in her maternal grandfather and maternal grandmother; Cataracts in her brother, brother, and brother; Diabetes in her mother; Healthy in her father and sister; Hyperlipidemia in her mother; Hypertension in her mother; Hyperthyroidism in her mother; Hypothyroidism in her sister; Osteoporosis in her mother. No Known Allergies No current outpatient medications on file prior to visit.   Current Facility-Administered Medications on File Prior to Visit  Medication Dose Route Frequency Provider Last Rate Last Dose  . 0.9 %  sodium chloride infusion  500 mL Intravenous Once Milus Banister, MD       Review of Systems Constitutional: Negative for other unusual diaphoresis, sweats, appetite or weight changes HENT: Negative for other worsening hearing loss, ear pain, facial swelling, mouth sores or neck stiffness.   Eyes: Negative for other worsening pain, redness or other visual disturbance.  Respiratory: Negative for other stridor or swelling Cardiovascular: Negative for other palpitations or other chest pain  Gastrointestinal: Negative for worsening diarrhea or loose stools, blood in stool, distention or other pain Genitourinary: Negative for hematuria, flank pain or other change in urine volume.  Musculoskeletal: Negative for myalgias or other joint swelling.  Skin: Negative for other color change, or other wound or worsening drainage.  Neurological: Negative for other syncope or numbness. Hematological: Negative for other adenopathy or swelling Psychiatric/Behavioral: Negative for hallucinations, other worsening agitation, SI, self-injury, or new decreased concentration All other system neg per pt    Objective:   Physical Exam BP 108/72   Pulse 84   Temp 98.4 F (36.9 C) (Oral)   Ht 5\' 1"  (1.549 m)   Wt 116 lb (52.6 kg)   SpO2 97%   BMI 21.92 kg/m  VS noted,  Constitutional: Pt is oriented to person, place, and time. Appears well-developed and well-nourished, in no significant distress and comfortable Head: Normocephalic and atraumatic  Eyes: Conjunctivae and EOM are normal. Pupils are equal, round, and reactive to light Right Ear: External ear normal without discharge Left Ear: External ear normal without discharge Nose: Nose without discharge or deformity Mouth/Throat: Oropharynx is without other ulcerations and moist  Neck: Normal range of motion. Neck supple. No JVD present. No  tracheal deviation present or significant neck LA or mass Cardiovascular: Normal rate, regular rhythm, normal heart sounds and intact distal pulses.   Pulmonary/Chest: WOB  normal and breath sounds without rales or wheezing  Abdominal: Soft. Bowel sounds are normal. NT. No HSM  Musculoskeletal: Normal range of motion. Exhibits no edema Lymphadenopathy: Has no other cervical adenopathy.  Neurological: Pt is alert and oriented to person, place, and time. Pt has normal reflexes. No cranial nerve deficit. Motor grossly intact, Gait intact Skin: Skin is warm and dry. No rash noted or new ulcerations Psychiatric:  Has normal mood and affect. Behavior is normal without agitation No other exam findings Lab Results  Component Value Date   WBC 7.8 10/23/2016   HGB 12.9 10/23/2016   HCT 38.6 10/23/2016   PLT 312.0 10/23/2016   GLUCOSE 105 (H) 10/23/2016   CHOL 206 (H) 10/23/2016   TRIG 132.0 10/23/2016   HDL 62.80 10/23/2016   LDLDIRECT 159.0 10/21/2012   LDLCALC 117 (H) 10/23/2016   ALT 12 10/23/2016   AST 16 10/23/2016   NA 140 10/23/2016   K 4.6 10/23/2016   CL 103 10/23/2016   CREATININE 0.85 10/23/2016   BUN 15 10/23/2016   CO2 32 10/23/2016   TSH 0.97 10/23/2016       Assessment & Plan:

## 2018-02-25 ENCOUNTER — Telehealth: Payer: Self-pay

## 2018-02-25 NOTE — Telephone Encounter (Signed)
Called pt, LVM.   CRM created.  

## 2018-02-25 NOTE — Telephone Encounter (Signed)
-----   Message from Biagio Borg, MD sent at 02/21/2018  4:49 PM EST ----- Left message on MyChart, pt to cont same tx except  The test results show that your current treatment is OK, except there is still some red blood cells in the urine, but there is also some white blood cells, which is more consistent with mild bladder irritation rather than anything else.  We should however go ahead and change the zocor to crestor 20 mg per day as this is much more effective for cholesterol .    Shirron to please inform pt, I will do rx

## 2018-05-04 MED FILL — MOMETASONE FUROATE 50 MCG S: 50 | 30 days supply | Qty: 17 | Fill #1

## 2018-05-05 MED FILL — OLOPATADINE HCL 0.1 % SOLN: 0.1 | 25 days supply | Qty: 5 | Fill #1

## 2018-05-15 MED FILL — ROSUVASTATIN CALCIUM 20 MG: 20 | 90 days supply | Qty: 90 | Fill #1

## 2018-05-24 IMAGING — DX DG CHEST 2V
2 series · 2 of 2 positions shown · non-contrast
Comparison: 04/05/2008

CLINICAL DATA: RIGHT upper quadrant abdominal pain, RIGHT lower
chest pain, symptoms for 2 weeks

EXAM:
CHEST  2 VIEW

[chest pa]
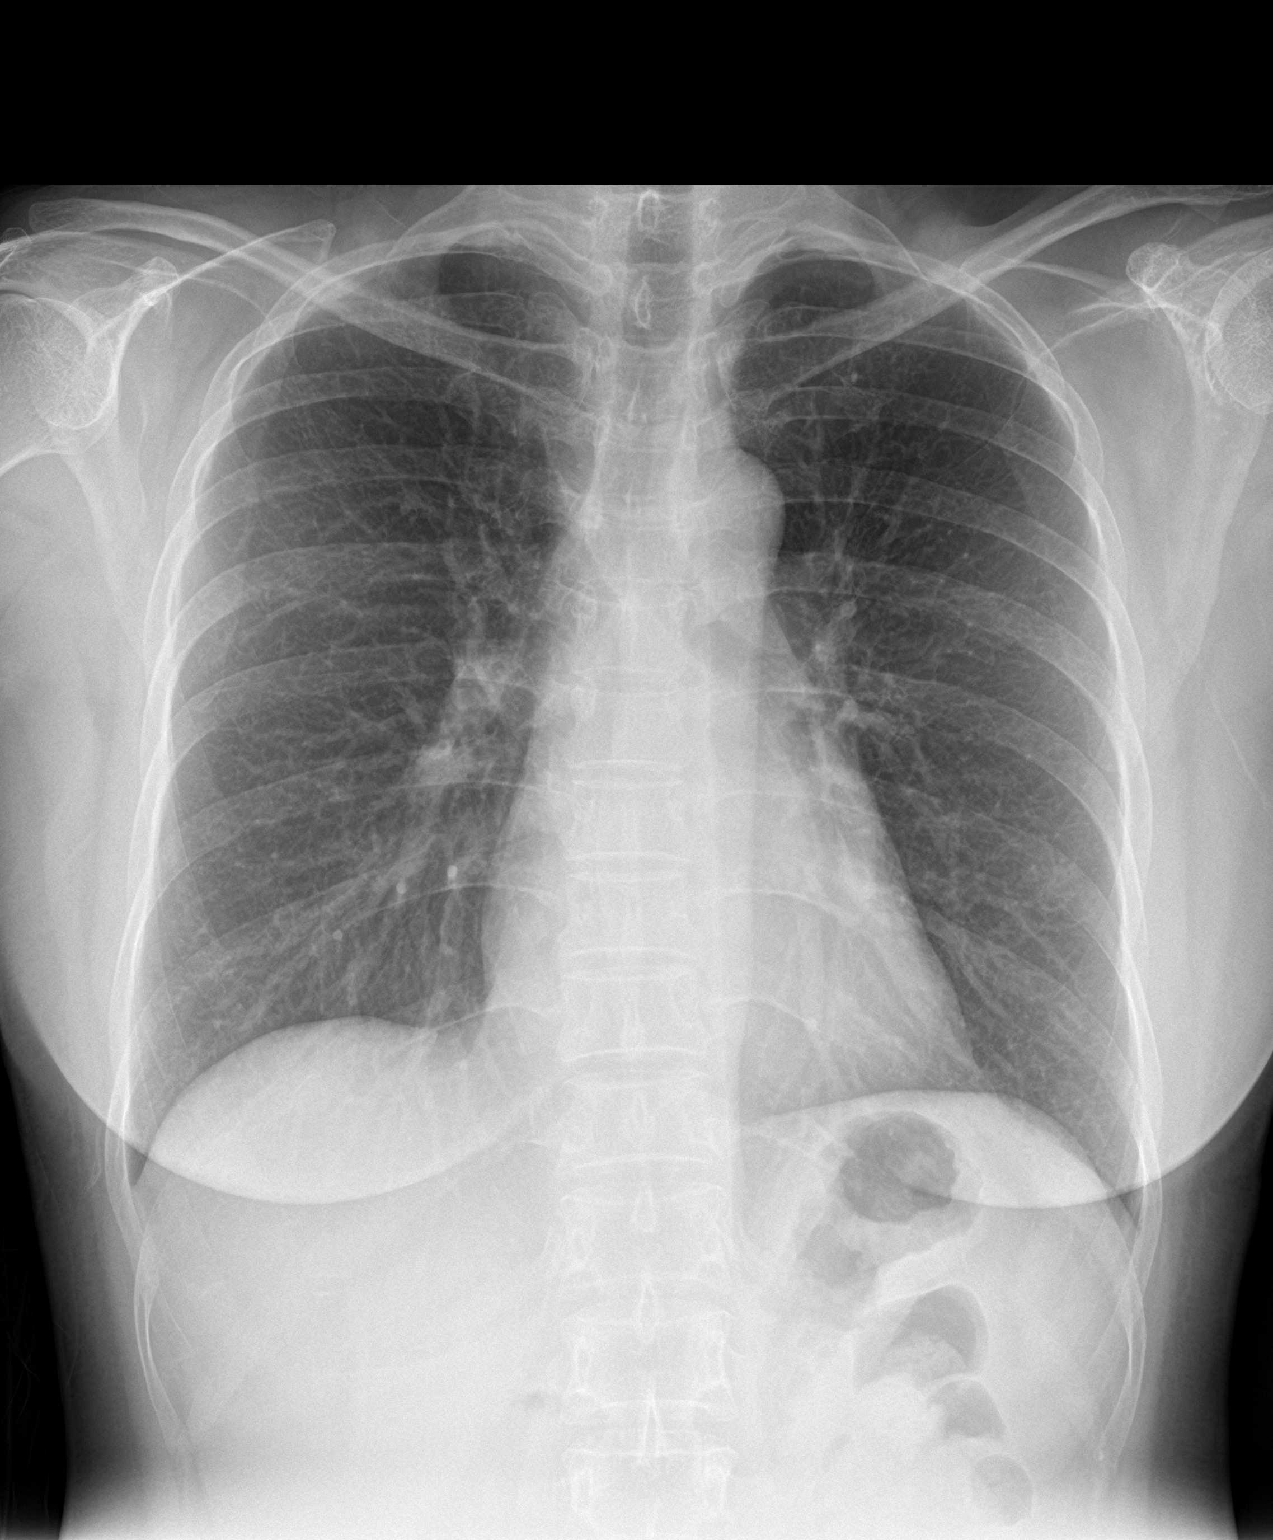

[chest lat]
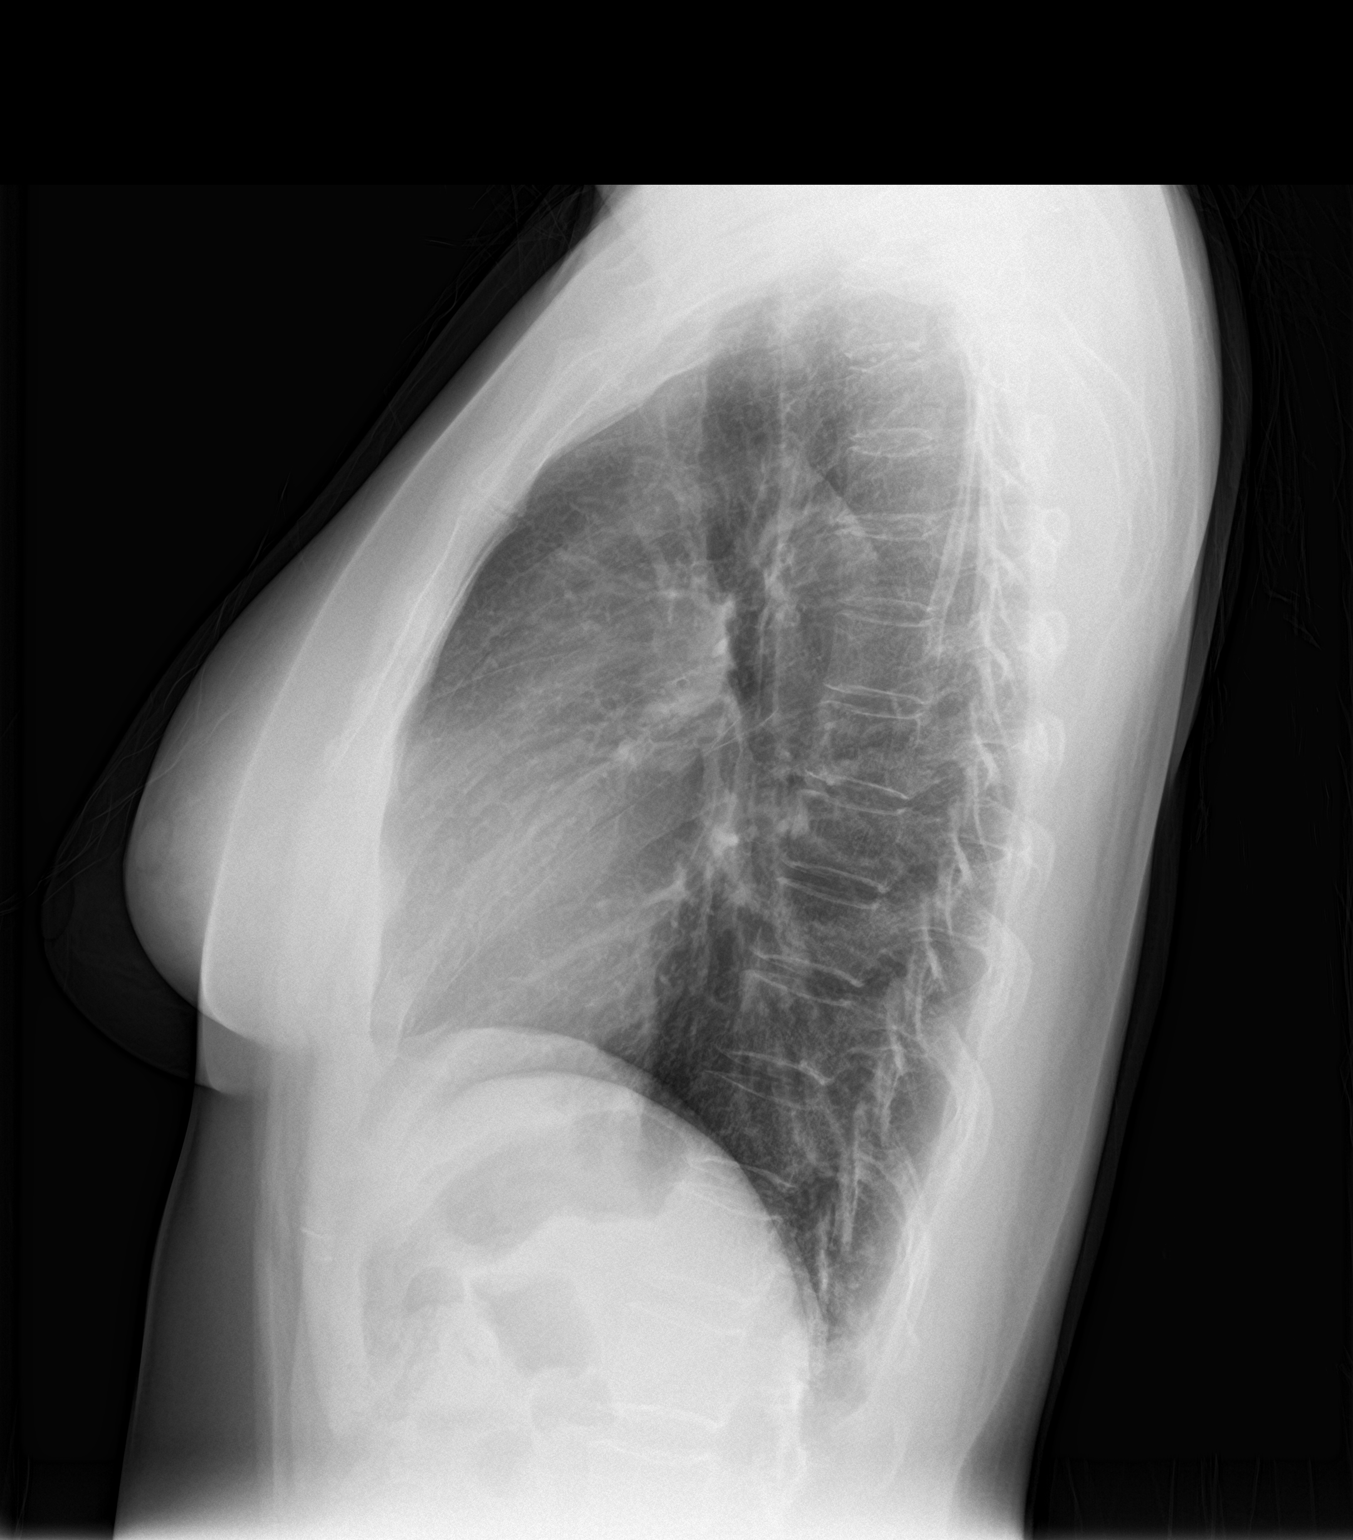

[2 of 2 positions shown; findings below may reference images not displayed]

FINDINGS: Normal heart size, mediastinal contours, and pulmonary vascularity.

Lungs clear.

No acute infiltrate, pleural effusion or pneumothorax.

Osseous structures unremarkable.
IMPRESSION: No acute abnormalities.

## 2018-09-10 MED FILL — ROSUVASTATIN CALCIUM 20 MG: 20 | 90 days supply | Qty: 90 | Fill #2

## 2018-09-10 MED FILL — MOMETASONE FUROATE 50 MCG S: 50 | 30 days supply | Qty: 17 | Fill #2

## 2018-09-10 MED FILL — OLOPATADINE HCL 0.1 % SOLN: 0.1 | 25 days supply | Qty: 5 | Fill #2

## 2018-12-14 MED FILL — ROSUVASTATIN CALCIUM 20 MG: 20 | 90 days supply | Qty: 90 | Fill #3

## 2018-12-14 MED FILL — MOMETASONE FUROATE 50 MCG S: 50 | 30 days supply | Qty: 17 | Fill #3

## 2018-12-14 MED FILL — OLOPATADINE HCL 0.1 % SOLN: 0.1 | 25 days supply | Qty: 5 | Fill #3

## 2019-02-27 ENCOUNTER — Other Ambulatory Visit: Payer: Self-pay

## 2019-02-27 ENCOUNTER — Encounter: Payer: Self-pay | Admitting: Internal Medicine

## 2019-02-27 ENCOUNTER — Other Ambulatory Visit: Payer: Self-pay | Admitting: Internal Medicine

## 2019-02-27 ENCOUNTER — Ambulatory Visit (INDEPENDENT_AMBULATORY_CARE_PROVIDER_SITE_OTHER): Payer: 59 | Admitting: Internal Medicine

## 2019-02-27 VITALS — BP 100/60 | HR 65 | Temp 98.3°F | Ht 60.0 in | Wt 109.6 lb

## 2019-02-27 DIAGNOSIS — Z0000 Encounter for general adult medical examination without abnormal findings: Secondary | ICD-10-CM | POA: Diagnosis not present

## 2019-02-27 DIAGNOSIS — E559 Vitamin D deficiency, unspecified: Secondary | ICD-10-CM

## 2019-02-27 DIAGNOSIS — E611 Iron deficiency: Secondary | ICD-10-CM | POA: Diagnosis not present

## 2019-02-27 DIAGNOSIS — R739 Hyperglycemia, unspecified: Secondary | ICD-10-CM | POA: Diagnosis not present

## 2019-02-27 DIAGNOSIS — Z1231 Encounter for screening mammogram for malignant neoplasm of breast: Secondary | ICD-10-CM | POA: Diagnosis not present

## 2019-02-27 DIAGNOSIS — E538 Deficiency of other specified B group vitamins: Secondary | ICD-10-CM | POA: Diagnosis not present

## 2019-02-27 LAB — LIPID PANEL
Cholesterol: 164 mg/dL (ref 0–200)
HDL: 60.5 mg/dL (ref >39.00)
LDL Cholesterol: 88 mg/dL (ref 0–99)
NonHDL: 103.1
Total CHOL/HDL Ratio: 3
Triglycerides: 75 mg/dL (ref 0.0–149.0)
VLDL: 15 mg/dL (ref 0.0–40.0)

## 2019-02-27 LAB — VITAMIN D 25 HYDROXY (VIT D DEFICIENCY, FRACTURES): VITD: 16.53 ng/mL — ABNORMAL LOW (ref 30.00–100.00)

## 2019-02-27 LAB — URINALYSIS, ROUTINE W REFLEX MICROSCOPIC
Bilirubin Urine: NEGATIVE
Ketones, ur: NEGATIVE
Leukocytes,Ua: NEGATIVE
Nitrite: NEGATIVE
Specific Gravity, Urine: >=1.03 — AB (ref 1.000–1.030)
Total Protein, Urine: NEGATIVE
Urine Glucose: NEGATIVE
Urobilinogen, UA: 0.2 (ref 0.0–1.0)
pH: 5.5 (ref 5.0–8.0)

## 2019-02-27 LAB — IBC PANEL
Iron: 67 ug/dL (ref 42–145)
Saturation Ratios: 18.8 % — ABNORMAL LOW (ref 20.0–50.0)
Transferrin: 255 mg/dL (ref 212.0–360.0)

## 2019-02-27 LAB — CBC WITH DIFFERENTIAL/PLATELET
Basophils Absolute: 0 10*3/uL (ref 0.0–0.1)
Basophils Relative: 0.5 % (ref 0.0–3.0)
Eosinophils Absolute: 0.2 10*3/uL (ref 0.0–0.7)
Eosinophils Relative: 2.7 % (ref 0.0–5.0)
HCT: 36.5 % (ref 36.0–46.0)
Hemoglobin: 12.3 g/dL (ref 12.0–15.0)
Lymphocytes Relative: 30 % (ref 12.0–46.0)
Lymphs Abs: 1.9 10*3/uL (ref 0.7–4.0)
MCHC: 33.8 g/dL (ref 30.0–36.0)
MCV: 84.7 fl (ref 78.0–100.0)
Monocytes Absolute: 0.4 10*3/uL (ref 0.1–1.0)
Monocytes Relative: 6.8 % (ref 3.0–12.0)
Neutro Abs: 3.8 10*3/uL (ref 1.4–7.7)
Neutrophils Relative %: 60 % (ref 43.0–77.0)
Platelets: 309 10*3/uL (ref 150.0–400.0)
RBC: 4.31 Mil/uL (ref 3.87–5.11)
RDW: 13.1 % (ref 11.5–15.5)
WBC: 6.3 10*3/uL (ref 4.0–10.5)

## 2019-02-27 LAB — TSH: TSH: 1.28 u[IU]/mL (ref 0.35–4.50)

## 2019-02-27 LAB — BASIC METABOLIC PANEL
BUN: 21 mg/dL (ref 6–23)
CO2: 28 mEq/L (ref 19–32)
Calcium: 10 mg/dL (ref 8.4–10.5)
Chloride: 106 mEq/L (ref 96–112)
Creatinine, Ser: 0.62 mg/dL (ref 0.40–1.20)
GFR: 100.4 mL/min (ref >60.00)
Glucose, Bld: 107 mg/dL — ABNORMAL HIGH (ref 70–99)
Potassium: 4.9 mEq/L (ref 3.5–5.1)
Sodium: 142 mEq/L (ref 135–145)

## 2019-02-27 LAB — HEPATIC FUNCTION PANEL
ALT: 24 U/L (ref 0–35)
AST: 22 U/L (ref 0–37)
Albumin: 4.5 g/dL (ref 3.5–5.2)
Alkaline Phosphatase: 71 U/L (ref 39–117)
Bilirubin, Direct: 0.1 mg/dL (ref 0.0–0.3)
Total Bilirubin: 0.4 mg/dL (ref 0.2–1.2)
Total Protein: 8 g/dL (ref 6.0–8.3)

## 2019-02-27 LAB — HEMOGLOBIN A1C: Hgb A1c MFr Bld: 5.9 % (ref 4.6–6.5)

## 2019-02-27 LAB — VITAMIN B12: Vitamin B-12: 392 pg/mL (ref 211–911)

## 2019-02-27 MED ORDER — VITAMIN D (ERGOCALCIFEROL) 1.25 MG (50000 UNIT) PO CAPS: 50000.0000 [IU] | ORAL_CAPSULE | ORAL | 0 refills | Status: AC

## 2019-02-27 MED FILL — VIT D2 1.25 MG (50,000 UNIT: 1.25 MG | 84 days supply | Qty: 12 | Fill #0

## 2019-02-27 NOTE — Progress Notes (Signed)
Subjective:    Patient ID: Lindsey Boyd, female    DOB: [birth year], 54 y.o.   MRN: [id number]  HPI  Here for wellness and f/u;  Overall doing ok;  Pt denies Chest pain, worsening SOB, DOE, wheezing, orthopnea, PND, worsening LE edema, palpitations, dizziness or syncope.  Pt denies neurological change such as new headache, facial or extremity weakness.  Pt denies polydipsia, polyuria, or low sugar symptoms. Pt states overall good compliance with treatment and medications, good tolerability, and has been trying to follow appropriate diet.  Pt denies worsening depressive symptoms, suicidal ideation or panic. No fever, night sweats, wt loss, loss of appetite, or other constitutional symptoms.  Pt states good ability with ADL's, has low fall risk, home safety reviewed and adequate, no other significant changes in hearing or vision, and only occasionally active with exercise. Had covid vaccine #1, #2 coming soon. Past Medical History:  Diagnosis Date  . Allergy   . Hyperlipidemia    Past Surgical History:  Procedure Laterality Date  . None      reports that she has never smoked. She has never used smokeless tobacco. She reports that she does not drink alcohol or use drugs. family history includes Breast cancer in her maternal grandmother; Cancer in her maternal grandfather and maternal grandmother; Cataracts in her brother, brother, and brother; Diabetes in her mother; Healthy in her father and sister; Hyperlipidemia in her mother; Hypertension in her mother; Hyperthyroidism in her mother; Hypothyroidism in her sister; Osteoporosis in her mother. No Known Allergies Current Outpatient Medications on File Prior to Visit  Medication Sig Dispense Refill  . mometasone (NASONEX) 50 MCG/ACT nasal spray INSTILL 2 SPRAYS INTO THE NOSE DAILY 17 g 11  . olopatadine (PATANOL) 0.1 % ophthalmic solution INSTILL 1 DROP INTO BOTH EYES 2 TIMES DAILY. 5 mL 12  . rosuvastatin (CRESTOR) 20 MG tablet Take 1 tablet (20 mg  total) by mouth daily. 90 tablet 3   Current Facility-Administered Medications on File Prior to Visit  Medication Dose Route Frequency Provider Last Rate Last Admin  . 0.9 %  sodium chloride infusion  500 mL Intravenous Once Milus Banister, MD       Review of Systems All otherwise neg per pt     Objective:   Physical Exam BP 100/60 (BP Location: Left Arm, Patient Position: Sitting, Cuff Size: Normal)   Pulse 65   Temp 98.3 F (36.8 C) (Oral)   Ht 5' (1.524 m)   Wt 109 lb 9.6 oz (49.7 kg)   SpO2 100%   BMI 21.40 kg/m  VS noted,  Constitutional: Pt appears in NAD HENT: Head: NCAT.  Right Ear: External ear normal.  Left Ear: External ear normal.  Eyes: . Pupils are equal, round, and reactive to light. Conjunctivae and EOM are normal Nose: without d/c or deformity Neck: Neck supple. Gross normal ROM Cardiovascular: Normal rate and regular rhythm.   Pulmonary/Chest: Effort normal and breath sounds without rales or wheezing.  Abd:  Soft, NT, ND, + BS, no organomegaly Neurological: Pt is alert. At baseline orientation, motor grossly intact Skin: Skin is warm. No rashes, other new lesions, no LE edema Psychiatric: Pt behavior is normal without agitation  All otherwise neg per pt Lab Results  Component Value Date   WBC 6.3 02/27/2019   HGB 12.3 02/27/2019   HCT 36.5 02/27/2019   PLT 309.0 02/27/2019   GLUCOSE 107 (H) 02/27/2019   CHOL 164 02/27/2019   TRIG 75.0 02/27/2019  HDL 60.50 02/27/2019   LDLDIRECT 159.0 10/21/2012   LDLCALC 88 02/27/2019   ALT 24 02/27/2019   AST 22 02/27/2019   NA 142 02/27/2019   K 4.9 02/27/2019   CL 106 02/27/2019   CREATININE 0.62 02/27/2019   BUN 21 02/27/2019   CO2 28 02/27/2019   TSH 1.28 02/27/2019   HGBA1C 5.9 02/27/2019      Assessment & Plan:

## 2019-02-27 NOTE — Assessment & Plan Note (Signed)

## 2019-02-27 NOTE — Patient Instructions (Addendum)
Please continue all other medications as before, and refills have been done if requested.  Please have the pharmacy call with any other refills you may need.  Please continue your efforts at being more active, low cholesterol diet, and weight control.  You are otherwise up to date with prevention measures today.  Please keep your appointments with your specialists as you may have planned  You will be contacted regarding the referral for: mammogram and GYN  Please go to the LAB at the blood drawing area for the tests to be done  You will be contacted by phone if any changes need to be made immediately.  Otherwise, you will receive a letter about your results with an explanation, but please check with MyChart first.  Please remember to sign up for MyChart if you have not done so, as this will be important to you in the future with finding out test results, communicating by private email, and scheduling acute appointments online when needed.  Please make an Appointment to return for your 1 year visit, or sooner if needed, with Lab testing by Appointment as well, to be done about 3-5 days before at the [facility] (so this is for TWO appointments - please see the scheduling desk as you leave)

## 2019-02-27 NOTE — Assessment & Plan Note (Signed)
stable overall by history and exam, recent data reviewed with pt, and pt to continue medical treatment as before,  to f/u any worsening symptoms or concerns  

## 2019-03-24 ENCOUNTER — Encounter: Payer: Self-pay | Admitting: Internal Medicine

## 2019-03-25 ENCOUNTER — Other Ambulatory Visit: Payer: Self-pay | Admitting: Internal Medicine

## 2019-03-25 DIAGNOSIS — Z1231 Encounter for screening mammogram for malignant neoplasm of breast: Secondary | ICD-10-CM

## 2019-03-26 ENCOUNTER — Ambulatory Visit (INDEPENDENT_AMBULATORY_CARE_PROVIDER_SITE_OTHER): Payer: 59

## 2019-03-26 ENCOUNTER — Other Ambulatory Visit: Payer: Self-pay

## 2019-03-26 DIAGNOSIS — Z1231 Encounter for screening mammogram for malignant neoplasm of breast: Secondary | ICD-10-CM | POA: Diagnosis not present

## 2019-03-28 ENCOUNTER — Other Ambulatory Visit: Payer: Self-pay | Admitting: Internal Medicine

## 2019-03-28 NOTE — Telephone Encounter (Signed)
Please refill as per office routine med refill policy (all routine meds refilled for 3 mo or monthly per pt preference up to one year from last visit, then month to month grace period for 3 mo, then further med refills will have to be denied)  

## 2019-03-30 ENCOUNTER — Other Ambulatory Visit: Payer: Self-pay | Admitting: Internal Medicine

## 2019-03-30 MED FILL — OLOPATADINE HCL 0.1 % SOLN: 0.1 | 25 days supply | Qty: 5 | Fill #0

## 2019-03-30 MED FILL — ROSUVASTATIN CALCIUM 20 MG: 20 | 90 days supply | Qty: 90 | Fill #0

## 2019-03-30 MED FILL — MOMETASONE FUROATE 50 MCG S: 50 | 30 days supply | Qty: 17 | Fill #0

## 2019-04-08 ENCOUNTER — Encounter: Payer: Self-pay | Admitting: Obstetrics and Gynecology

## 2019-04-08 ENCOUNTER — Other Ambulatory Visit (HOSPITAL_COMMUNITY)
Admission: RE | Admit: 2019-04-08 | Discharge: 2019-04-08 | Disposition: A | Discharge status: Home / Self Care | Payer: 59 | Source: Ambulatory Visit | Attending: Obstetrics and Gynecology | Admitting: Obstetrics and Gynecology

## 2019-04-08 ENCOUNTER — Ambulatory Visit (INDEPENDENT_AMBULATORY_CARE_PROVIDER_SITE_OTHER): Payer: 59 | Admitting: Obstetrics and Gynecology

## 2019-04-08 ENCOUNTER — Other Ambulatory Visit: Payer: Self-pay

## 2019-04-08 VITALS — BP 89/52 | HR 78 | Temp 98.4°F | Resp 16 | Ht 60.0 in | Wt 109.0 lb

## 2019-04-08 DIAGNOSIS — Z01419 Encounter for gynecological examination (general) (routine) without abnormal findings: Secondary | ICD-10-CM | POA: Insufficient documentation

## 2019-04-08 DIAGNOSIS — N898 Other specified noninflammatory disorders of vagina: Secondary | ICD-10-CM

## 2019-04-08 DIAGNOSIS — R300 Dysuria: Secondary | ICD-10-CM | POA: Diagnosis not present

## 2019-04-08 DIAGNOSIS — L900 Lichen sclerosus et atrophicus: Secondary | ICD-10-CM | POA: Diagnosis not present

## 2019-04-08 DIAGNOSIS — Z113 Encounter for screening for infections with a predominantly sexual mode of transmission: Secondary | ICD-10-CM

## 2019-04-08 MED ORDER — CLOBETASOL PROP EMOLLIENT BASE 0.05 % EX CREA: 1.0000 "application " | TOPICAL_CREAM | Freq: Two times a day (BID) | CUTANEOUS | 2 refills | Status: AC

## 2019-04-08 MED FILL — CLOBETASOL EMOLLIENT 0.05%: 0.05 | 30 days supply | Qty: 30 | Fill #0

## 2019-04-08 NOTE — Patient Instructions (Signed)
Lichen Sclerosus Lichen sclerosus is a skin problem. It can happen on any part of the body, but it commonly involves the anal or genital areas. It can cause itching and discomfort in these areas. Treatment can help to control symptoms. When the genital area is affected, getting treatment is important because the condition can cause scarring that may lead to other problems. What are the causes? The cause of this condition is not known. It may be related to an overactive immune system or a lack of certain hormones. Lichen sclerosus is not an infection or a fungus, and it is not passed from one person to another (not contagious). What increases the risk? This condition is more likely to develop in women, usually after menopause. What are the signs or symptoms? Symptoms of this condition include:  Thin, wrinkled, white areas on the skin.  Thickened white areas on the skin.  Red and swollen patches (lesions) on the skin.  Tears or cracks in the skin.  Bruising.  Blood blisters.  Severe itching.  Pain, itching, or burning when urinating. Constipation is also common in people with lichen sclerosus. How is this diagnosed? This condition may be diagnosed with a physical exam. In some cases, a tissue sample (biopsy sample) may be removed to be looked at under a microscope. How is this treated? This condition is usually treated with medicated creams or ointments (topical steroids) that are applied over the affected areas. In some cases, treatment may also include medicines that are taken by mouth. Surgery may be needed in more severe cases that are causing problems such as scarring. Follow these instructions at home:  Take or use over-the-counter and prescription medicines only as told by your health care provider.  Use creams or ointments as told by your health care provider.  Do not scratch the affected areas of skin.  If you are a woman, be sure to keep the vaginal area as clean and dry  as possible.  Clean the affected area of skin gently with water. Avoid using rough towels or toilet paper.  Keep all follow-up visits as told by your health care provider. This is important. Contact a health care provider if:  You have increasing redness, swelling, or pain in the affected area.  You have fluid, blood, or pus coming from the affected area.  You have new lesions on your skin.  You have a fever.  You have pain during sex. Summary  Lichen sclerosus is a skin problem. When the genital area is affected, getting treatment is important because the condition can cause scarring that may lead to other problems.  This condition is usually treated with medicated creams or ointments (topical steroids) that are applied over the affected areas.  Take or use over-the-counter and prescription medicines only as told by your health care provider.  Contact a health care provider if you have new lesions on your skin, have pain during sex, or have increasing redness, swelling, or pain in the affected area.  Keep all follow-up visits as told by your health care provider. This is important. This information is not intended to replace advice given to you by your health care provider. Make sure you discuss any questions you have with your health care provider. Document Revised: 05/30/2017 Document Reviewed: 05/30/2017 Elsevier Patient Education  2020 Elsevier Inc.  

## 2019-04-08 NOTE — Progress Notes (Signed)
GYNECOLOGY ANNUAL PREVENTATIVE CARE ENCOUNTER NOTE  History:     Lindsey Boyd is a 54 y.o. G13P2000 female here for a routine annual gynecologic exam.  Current complaints: mons pubis itching, vulvar itching. States the itching is so bad that she is staying up all night.   Denies abnormal vaginal bleeding, discharge, pelvic pain, problems with intercourse or other gynecologic concerns.   Gynecologic History No LMP recorded. (Menstrual status: Perimenopausal). Contraception: Post menopausal  Last Pap:  Unknown. Last mammogram: 03/26/19 Results were: normal  Obstetric History OB History  Gravida Para Term Preterm AB Living  2 2 2         SAB TAB Ectopic Multiple Live Births          2    # Outcome Date GA Lbr Len/2nd Weight Sex Delivery Anes PTL Lv  2 Term      Vag-Spont     1 Term      Vag-Spont       Past Medical History:  Diagnosis Date  . Allergy   . Hx of seasonal allergies   . Hyperlipidemia   . Hyperlipidemia     Past Surgical History:  Procedure Laterality Date  . None      Current Outpatient Medications on File Prior to Visit  Medication Sig Dispense Refill  . mometasone (NASONEX) 50 MCG/ACT nasal spray PLACE 2 SPRAYS IN EACH NOSTRIL ONCE A DAY 17 g 11  . olopatadine (PATANOL) 0.1 % ophthalmic solution PLACE 1 DROP IN BOTH EYES TWO TIMES DAILY 5 mL 12  . rosuvastatin (CRESTOR) 20 MG tablet TAKE 1 TABLET (20 MG TOTAL) BY MOUTH DAILY. **THIS REPLACES SIMVASTATIN** 90 tablet 3  . Vitamin D, Ergocalciferol, (DRISDOL) 1.25 MG (50000 UNIT) CAPS capsule Take 1 capsule (50,000 Units total) by mouth every 7 (seven) days. 12 capsule 0   Current Facility-Administered Medications on File Prior to Visit  Medication Dose Route Frequency Provider Last Rate Last Admin  . 0.9 %  sodium chloride infusion  500 mL Intravenous Once Milus Banister, MD        No Known Allergies  Social History:  reports that she has never smoked. She has never used smokeless tobacco. She reports  that she does not drink alcohol or use drugs.  Family History  Problem Relation Age of Onset  . Diabetes Mother   . Hyperlipidemia Mother   . Hypertension Mother   . Osteoporosis Mother   . Hyperthyroidism Mother   . Healthy Father   . Hypothyroidism Sister   . Cataracts Brother   . Healthy Sister   . Cataracts Brother   . Cataracts Brother   . Cancer Maternal Grandmother        breast  . Breast cancer Maternal Grandmother   . Cancer Maternal Grandfather        liver  . Colon cancer Neg Hx   . Rectal cancer Neg Hx   . Stomach cancer Neg Hx   . Esophageal cancer Neg Hx   . Liver cancer Neg Hx     The following portions of the patient's history were reviewed and updated as appropriate: allergies, current medications, past family history, past medical history, past social history, past surgical history and problem list.  Review of Systems Pertinent items noted in HPI and remainder of comprehensive ROS otherwise negative.  Physical Exam:  BP (!) 89/52   Pulse 78   Temp 98.4 F (36.9 C)   Resp 16   Ht 5' (  1.524 m)   Wt 109 lb (49.4 kg)   BMI 21.29 kg/m  CONSTITUTIONAL: Well-developed, well-nourished female in no acute distress.  HENT:  Normocephalic, atraumatic, External right and left ear normal. Oropharynx is clear and moist EYES: Conjunctivae and EOM are normal. Pupils are equal, round, and reactive to light. No scleral icterus.  NECK: Normal range of motion, supple, no masses.  Normal thyroid.  SKIN: Skin is warm and dry. No rash noted. Not diaphoretic. No erythema. No pallor. MUSCULOSKELETAL: Normal range of motion. No tenderness.  No cyanosis, clubbing, or edema.  2+ distal pulses. NEUROLOGIC: Alert and oriented to person, place, and time. Normal reflexes, muscle tone coordination.  PSYCHIATRIC: Normal mood and affect. Normal behavior. Normal judgment and thought content. CARDIOVASCULAR: Normal heart rate noted, regular rhythm RESPIRATORY: Clear to auscultation  bilaterally. Effort and breath sounds normal, no problems with respiration noted. BREASTS: Symmetric in size. No masses, tenderness, skin changes, nipple drainage, or lymphadenopathy bilaterally. ABDOMEN: Soft, no distention noted.  No tenderness, rebound or guarding.  PELVIC: dry patch with open scratches noted on mons-pubis. Darkening of skin noted on bilateral labial majora.  Normal appearing cervix.  No abnormal discharge noted.  Pap smear obtained.  Normal uterine size, no other palpable masses, no uterine or adnexal tenderness.          Assessment and Plan:   1. Dysuria  - Urine Culture  2. Vaginal irritation  - Cervicovaginal ancillary only( [place])  3. Well woman exam with routine gynecological exam  - Cytology - PAP( [place])  4. Lichen sclerosus  Rx: Clobetasol. Return in 2 weeks to see MD for follow up.   Will follow up results of pap smear and manage accordingly. Routine preventative health maintenance measures emphasized. Mammogram normal- repeat in one year.  Please refer to After Visit Summary for other counseling recommendations.   Cythia Bachtel, Artist Pais, [place] for [employer], [place]

## 2019-04-09 LAB — URINE CULTURE
MICRO NUMBER:: 10235805
Result:: NO GROWTH
SPECIMEN QUALITY:: ADEQUATE

## 2019-04-09 LAB — CERVICOVAGINAL ANCILLARY ONLY
Bacterial Vaginitis (gardnerella): NEGATIVE
Candida Glabrata: NEGATIVE
Candida Vaginitis: NEGATIVE
Chlamydia: NEGATIVE
Comment: NEGATIVE
Comment: NEGATIVE
Comment: NEGATIVE
Comment: NEGATIVE
Comment: NEGATIVE
Comment: NORMAL
Neisseria Gonorrhea: NEGATIVE
Trichomonas: NEGATIVE

## 2019-04-10 LAB — CYTOLOGY - PAP
Comment: NEGATIVE
Diagnosis: NEGATIVE
High risk HPV: NEGATIVE

## 2019-04-20 ENCOUNTER — Encounter: Payer: Self-pay | Admitting: Obstetrics & Gynecology

## 2019-04-20 ENCOUNTER — Other Ambulatory Visit (HOSPITAL_COMMUNITY)
Admission: RE | Admit: 2019-04-20 | Discharge: 2019-04-20 | Disposition: A | Discharge status: Home / Self Care | Payer: 59 | Source: Ambulatory Visit | Attending: Obstetrics & Gynecology | Admitting: Obstetrics & Gynecology

## 2019-04-20 ENCOUNTER — Ambulatory Visit (INDEPENDENT_AMBULATORY_CARE_PROVIDER_SITE_OTHER): Payer: 59 | Admitting: Obstetrics & Gynecology

## 2019-04-20 ENCOUNTER — Other Ambulatory Visit: Payer: Self-pay

## 2019-04-20 VITALS — BP 97/66 | HR 71 | Ht 60.0 in | Wt 109.0 lb

## 2019-04-20 DIAGNOSIS — N762 Acute vulvitis: Secondary | ICD-10-CM | POA: Diagnosis not present

## 2019-04-20 DIAGNOSIS — N9089 Other specified noninflammatory disorders of vulva and perineum: Secondary | ICD-10-CM | POA: Diagnosis not present

## 2019-04-20 DIAGNOSIS — L900 Lichen sclerosus et atrophicus: Secondary | ICD-10-CM | POA: Diagnosis not present

## 2019-04-20 NOTE — Progress Notes (Signed)
   Subjective:    Patient ID: Lindsey Boyd, female    DOB: January 26, 1966, 54 y.o.   MRN: [id number]  HPI  54 yo female presents for further evaluation of vulva.  Pt has been using clobetasol near the clitoral area.  She has not used it by the introitus where discoloration is.  She does not have itching in the  Inferior vulva and introitus.  No discharge.  No bleeding.    Review of Systems  Constitutional: Negative.   Respiratory: Negative.   Cardiovascular: Negative.   Gastrointestinal: Negative.   Genitourinary: Negative.        Objective:   Physical Exam Vitals reviewed. Exam conducted with a chaperone present.  Constitutional:      General: She is not in acute distress.    Appearance: She is well-developed.  HENT:     Head: Normocephalic and atraumatic.  Eyes:     Conjunctiva/sclera: Conjunctivae normal.  Cardiovascular:     Rate and Rhythm: Normal rate.  Pulmonary:     Effort: Pulmonary effort is normal.  Genitourinary:   Skin:    General: Skin is warm and dry.  Neurological:     Mental Status: She is alert and oriented to person, place, and time.       Assessment & Plan:  1.  Increase area of clobetasol to include all white area.  2.  Vulvar biopsy of discolored area.  Vulvar biopsy of white vulvar tissue (clinically c/w lichen sclerosis).  Continue bid clobetasol for 2 weeks.  Will reassess and hopefully taper down to twice a week maintenance.

## 2019-04-24 ENCOUNTER — Other Ambulatory Visit: Payer: Self-pay | Admitting: Anatomic Pathology & Clinical Pathology

## 2019-04-24 LAB — SURGICAL PATHOLOGY

## 2019-05-11 ENCOUNTER — Other Ambulatory Visit: Payer: Self-pay

## 2019-05-11 ENCOUNTER — Ambulatory Visit: Payer: 59 | Admitting: Obstetrics & Gynecology

## 2019-05-11 ENCOUNTER — Encounter: Payer: Self-pay | Admitting: Obstetrics & Gynecology

## 2019-05-11 VITALS — BP 91/57 | HR 78 | Temp 98.6°F | Resp 16 | Ht 60.0 in | Wt 108.0 lb

## 2019-05-11 DIAGNOSIS — L900 Lichen sclerosus et atrophicus: Secondary | ICD-10-CM

## 2019-05-11 NOTE — Progress Notes (Signed)
   Subjective:    Patient ID: Lindsey Boyd, female    DOB: 11-16-1965, 55 y.o.   MRN: [id number]  HPI  54 yo female presents for f/u of vulvar itching.  Biopsies left labia whowed inflammation.  Right mons showed blood although clinically LS and there was tissue in the vial when sent.  Pt has been appplying clobetason bid for a week then daily for a week and itching and burning have stopped.    Review of Systems  Constitutional: Negative.   Respiratory: Negative.   Cardiovascular: Negative.   Genitourinary: Negative for vaginal bleeding, vaginal discharge and vaginal pain.       Objective:   Physical Exam Vitals reviewed.  Constitutional:      General: She is not in acute distress.    Appearance: She is well-developed.  HENT:     Head: Normocephalic and atraumatic.  Eyes:     Conjunctiva/sclera: Conjunctivae normal.  Cardiovascular:     Rate and Rhythm: Normal rate.  Pulmonary:     Effort: Pulmonary effort is normal.  Genitourinary:    Comments: Vulvar biopsy sites well healed.   Skin:    General: Skin is warm and dry.  Neurological:     Mental Status: She is alert and oriented to person, place, and time.    Vitals:   05/11/19 1607  BP: (!) 91/57  Pulse: 78  Resp: 16  Temp: 98.6 F (37 C)  Weight: 108 lb (49 kg)  Height: 5' (1.524 m)       Assessment & Plan:  Lichen sclerosis  1.  Clobetasol twice a week 2.  Yearly appts with GYN 3.  Pap negative in 2021 4.  Mammogram normal in 2021

## 2019-07-28 DIAGNOSIS — H524 Presbyopia: Secondary | ICD-10-CM | POA: Diagnosis not present

## 2019-07-28 DIAGNOSIS — H5213 Myopia, bilateral: Secondary | ICD-10-CM | POA: Diagnosis not present

## 2019-10-20 MED FILL — OLOPATADINE HCL 0.1 % SOLN: 0.1 | 25 days supply | Qty: 5 | Fill #1

## 2019-10-20 MED FILL — ROSUVASTATIN CALCIUM 20 MG: 20 | 90 days supply | Qty: 90 | Fill #2

## 2019-10-20 MED FILL — MOMETASONE FUROATE 50 MCG S: 50 | 30 days supply | Qty: 17 | Fill #1

## 2020-01-01 ENCOUNTER — Ambulatory Visit: Payer: 59 | Attending: Internal Medicine

## 2020-01-01 ENCOUNTER — Other Ambulatory Visit (HOSPITAL_BASED_OUTPATIENT_CLINIC_OR_DEPARTMENT_OTHER): Payer: Self-pay | Admitting: Internal Medicine

## 2020-01-01 DIAGNOSIS — Z23 Encounter for immunization: Secondary | ICD-10-CM

## 2020-01-01 NOTE — Progress Notes (Signed)
   Covid-19 Vaccination Clinic  Name:  NEMA OATLEY    MRN: [id number] DOB: July 24, 1965  01/01/2020  Ms. Flax was observed post Covid-19 immunization for 15 minutes without incident. She was provided with Vaccine Information Sheet and instruction to access the V-Safe system.   Ms. Lamarre was instructed to call 911 with any severe reactions post vaccine: Marland Kitchen Difficulty breathing  . Swelling of face and throat  . A fast heartbeat  . A bad rash all over body  . Dizziness and weakness   Immunizations Administered    Name Date Dose VIS Date Route   Pfizer COVID-19 Vaccine 01/01/2020  9:55 AM 0.3 mL 11/18/2019 Intramuscular   Manufacturer: [place]   Lot: [id number]   [place]: [id number]

## 2020-01-05 MED FILL — PFIZER-BIONTECH COVID-19 VA: 30 | 1 days supply | Qty: 0 | Fill #0

## 2020-02-03 MED FILL — ROSUVASTATIN CALCIUM 20 MG: 20 | 90 days supply | Qty: 90 | Fill #3

## 2020-04-19 ENCOUNTER — Other Ambulatory Visit (HOSPITAL_BASED_OUTPATIENT_CLINIC_OR_DEPARTMENT_OTHER): Payer: Self-pay

## 2020-05-23 ENCOUNTER — Other Ambulatory Visit: Payer: Self-pay | Admitting: Internal Medicine

## 2020-05-23 ENCOUNTER — Other Ambulatory Visit (HOSPITAL_COMMUNITY): Payer: Self-pay

## 2020-05-23 NOTE — Telephone Encounter (Signed)
Please refill as per office routine med refill policy (all routine meds refilled for 3 mo or monthly per pt preference up to one year from last visit, then month to month grace period for 3 mo, then further med refills will have to be denied)  

## 2020-05-24 ENCOUNTER — Other Ambulatory Visit (HOSPITAL_COMMUNITY): Payer: Self-pay

## 2020-05-24 MED ORDER — ROSUVASTATIN CALCIUM 20 MG PO TABS
ORAL_TABLET | ORAL | 0 refills | Status: DC
  Filled 2020-05-24: qty 30, 30d supply, fill #0

## 2020-05-26 ENCOUNTER — Ambulatory Visit (INDEPENDENT_AMBULATORY_CARE_PROVIDER_SITE_OTHER): Payer: 59

## 2020-05-26 ENCOUNTER — Other Ambulatory Visit: Payer: Self-pay | Admitting: Internal Medicine

## 2020-05-26 ENCOUNTER — Other Ambulatory Visit: Payer: Self-pay

## 2020-05-26 DIAGNOSIS — Z1231 Encounter for screening mammogram for malignant neoplasm of breast: Secondary | ICD-10-CM | POA: Diagnosis not present

## 2020-06-06 ENCOUNTER — Other Ambulatory Visit: Payer: Self-pay

## 2020-06-07 ENCOUNTER — Ambulatory Visit (INDEPENDENT_AMBULATORY_CARE_PROVIDER_SITE_OTHER): Payer: 59 | Admitting: Internal Medicine

## 2020-06-07 ENCOUNTER — Encounter: Payer: Self-pay | Admitting: Internal Medicine

## 2020-06-07 ENCOUNTER — Other Ambulatory Visit: Payer: Self-pay | Admitting: Internal Medicine

## 2020-06-07 VITALS — BP 102/68 | HR 69 | Temp 97.9°F | Ht 59.75 in | Wt 111.2 lb

## 2020-06-07 DIAGNOSIS — Z1159 Encounter for screening for other viral diseases: Secondary | ICD-10-CM | POA: Diagnosis not present

## 2020-06-07 DIAGNOSIS — R739 Hyperglycemia, unspecified: Secondary | ICD-10-CM | POA: Diagnosis not present

## 2020-06-07 DIAGNOSIS — R0683 Snoring: Secondary | ICD-10-CM

## 2020-06-07 DIAGNOSIS — E7800 Pure hypercholesterolemia, unspecified: Secondary | ICD-10-CM

## 2020-06-07 DIAGNOSIS — E559 Vitamin D deficiency, unspecified: Secondary | ICD-10-CM | POA: Diagnosis not present

## 2020-06-07 DIAGNOSIS — R3129 Other microscopic hematuria: Secondary | ICD-10-CM

## 2020-06-07 DIAGNOSIS — Z0001 Encounter for general adult medical examination with abnormal findings: Secondary | ICD-10-CM | POA: Diagnosis not present

## 2020-06-07 DIAGNOSIS — R519 Headache, unspecified: Secondary | ICD-10-CM | POA: Diagnosis not present

## 2020-06-07 LAB — CBC WITH DIFFERENTIAL/PLATELET
Basophils Absolute: 0 10*3/uL (ref 0.0–0.1)
Basophils Relative: 0.6 % (ref 0.0–3.0)
Eosinophils Absolute: 0.2 10*3/uL (ref 0.0–0.7)
Eosinophils Relative: 2.7 % (ref 0.0–5.0)
HCT: 37.5 % (ref 36.0–46.0)
Hemoglobin: 12.6 g/dL (ref 12.0–15.0)
Lymphocytes Relative: 33.5 % (ref 12.0–46.0)
Lymphs Abs: 2 10*3/uL (ref 0.7–4.0)
MCHC: 33.6 g/dL (ref 30.0–36.0)
MCV: 84.1 fl (ref 78.0–100.0)
Monocytes Absolute: 0.5 10*3/uL (ref 0.1–1.0)
Monocytes Relative: 7.5 % (ref 3.0–12.0)
Neutro Abs: 3.4 10*3/uL (ref 1.4–7.7)
Neutrophils Relative %: 55.7 % (ref 43.0–77.0)
Platelets: 288 10*3/uL (ref 150.0–400.0)
RBC: 4.45 Mil/uL (ref 3.87–5.11)
RDW: 13.2 % (ref 11.5–15.5)
WBC: 6.1 10*3/uL (ref 4.0–10.5)

## 2020-06-07 LAB — LIPID PANEL
Cholesterol: 193 mg/dL (ref 0–200)
HDL: 62.6 mg/dL (ref >39.00)
LDL Cholesterol: 109 mg/dL — ABNORMAL HIGH (ref 0–99)
NonHDL: 130.47
Total CHOL/HDL Ratio: 3
Triglycerides: 108 mg/dL (ref 0.0–149.0)
VLDL: 21.6 mg/dL (ref 0.0–40.0)

## 2020-06-07 LAB — BASIC METABOLIC PANEL
BUN: 14 mg/dL (ref 6–23)
CO2: 30 mEq/L (ref 19–32)
Calcium: 10.1 mg/dL (ref 8.4–10.5)
Chloride: 102 mEq/L (ref 96–112)
Creatinine, Ser: 0.66 mg/dL (ref 0.40–1.20)
GFR: 99.02 mL/min (ref >60.00)
Glucose, Bld: 103 mg/dL — ABNORMAL HIGH (ref 70–99)
Potassium: 3.6 mEq/L (ref 3.5–5.1)
Sodium: 141 mEq/L (ref 135–145)

## 2020-06-07 LAB — URINALYSIS, ROUTINE W REFLEX MICROSCOPIC
Bilirubin Urine: NEGATIVE
Ketones, ur: NEGATIVE
Nitrite: NEGATIVE
Specific Gravity, Urine: <=1.005 — AB (ref 1.000–1.030)
Total Protein, Urine: NEGATIVE
Urine Glucose: NEGATIVE
Urobilinogen, UA: 0.2 (ref 0.0–1.0)
pH: 6.5 (ref 5.0–8.0)

## 2020-06-07 LAB — HEPATIC FUNCTION PANEL
ALT: 17 U/L (ref 0–35)
AST: 19 U/L (ref 0–37)
Albumin: 4.7 g/dL (ref 3.5–5.2)
Alkaline Phosphatase: 77 U/L (ref 39–117)
Bilirubin, Direct: 0.1 mg/dL (ref 0.0–0.3)
Total Bilirubin: 0.6 mg/dL (ref 0.2–1.2)
Total Protein: 8.3 g/dL (ref 6.0–8.3)

## 2020-06-07 LAB — TSH: TSH: 1.49 u[IU]/mL (ref 0.35–4.50)

## 2020-06-07 LAB — VITAMIN D 25 HYDROXY (VIT D DEFICIENCY, FRACTURES): VITD: 35.24 ng/mL (ref 30.00–100.00)

## 2020-06-07 LAB — HEMOGLOBIN A1C: Hgb A1c MFr Bld: 6 % (ref 4.6–6.5)

## 2020-06-07 NOTE — Progress Notes (Signed)
Patient ID: Lindsey Boyd, female   DOB: [birth year], 55 y.o.   MRN: [id number]         Chief Complaint:: wellness exam and low vit d, hyperglycemia, hld, snoring, and left facial pain       HPI:  Lindsey Boyd is a 55 y.o. female here for wellness exam; up to date with preventive referrals and immunizations                        Also taking vit d 2000 u.   Pt denies polydipsia, polyuria, or new focal neuro s/s.  Trying to follow lower chol diet.  Has worsening snoring over the past year, denies daytime somnolence, but asks for ENT eval.   Also had recent 1-2 wks moderate left mid facial pain but teeth and sinuses did not seem obviously abnormal.  Pt denies chest pain, increased sob or doe, wheezing, orthopnea, PND, increased LE swelling, palpitations, dizziness or syncope.   Pt denies fever, wt loss, night sweats, loss of appetite, or other constitutional symptoms   Wt Readings from Last 3 Encounters:  06/07/20 111 lb 3.2 oz (50.4 kg)  05/11/19 108 lb (49 kg)  04/20/19 109 lb (49.4 kg)   BP Readings from Last 3 Encounters:  06/07/20 102/68  05/11/19 (!) 91/57  04/20/19 97/66   Immunization History  Administered Date(s) Administered  . Influenza,inj,Quad PF,6+ Mos 10/19/2015  . Influenza-Unspecified 10/30/2018  . PFIZER(Purple Top)SARS-COV-2 Vaccination 02/17/2019, 03/10/2019, 01/01/2020  . Tdap 04/10/2011   There are no preventive care reminders to display for this patient.    Past Medical History:  Diagnosis Date  . Allergy   . Hx of seasonal allergies   . Hyperlipidemia   . Hyperlipidemia    Past Surgical History:  Procedure Laterality Date  . None      reports that she has never smoked. She has never used smokeless tobacco. She reports that she does not drink alcohol and does not use drugs. family history includes Breast cancer in her maternal grandmother; Cancer in her maternal grandfather and maternal grandmother; Cataracts in her brother, brother, and brother; Diabetes in her  mother; Healthy in her father and sister; Hyperlipidemia in her mother; Hypertension in her mother; Hyperthyroidism in her mother; Hypothyroidism in her sister; Osteoporosis in her mother. No Known Allergies Current Outpatient Medications on File Prior to Visit  Medication Sig Dispense Refill  . clobetasol cream (TEMOVATE) 4.09 % Apply 1 application topically 2 (two) times a week.    . mometasone (NASONEX) 50 MCG/ACT nasal spray PLACE 2 SPRAYS IN EACH NOSTRIL ONCE A DAY 17 g 11  . olopatadine (PATANOL) 0.1 % ophthalmic solution PLACE 1 DROP IN BOTH EYES TWO TIMES DAILY 5 mL 12  . rosuvastatin (CRESTOR) 20 MG tablet TAKE 1 TABLET (20 MG TOTAL) BY MOUTH DAILY. **THIS REPLACES SIMVASTATIN** 30 tablet 0  . Vitamin D, Ergocalciferol, (DRISDOL) 1.25 MG (50000 UNIT) CAPS capsule Take 1 capsule (50,000 Units total) by mouth every 7 (seven) days. 12 capsule 0   Current Facility-Administered Medications on File Prior to Visit  Medication Dose Route Frequency Provider Last Rate Last Admin  . 0.9 %  sodium chloride infusion  500 mL Intravenous Once Milus Banister, MD            ROS:  All others reviewed and negative.  Objective        PE:  BP 102/68 (BP Location: Left Arm, Patient Position: Sitting, Cuff Size:  Normal)   Pulse 69   Temp 97.9 F (36.6 C) (Oral)   Ht 4' 11.75" (1.518 m)   Wt 111 lb 3.2 oz (50.4 kg)   LMP 03/30/2011   SpO2 99%   BMI 21.90 kg/m                 Constitutional: Pt appears in NAD               HENT: Head: NCAT.                Right Ear: External ear normal.                 Left Ear: External ear normal.                Eyes: . Pupils are equal, round, and reactive to light. Conjunctivae and EOM are normal               Nose: without d/c or deformity               Neck: Neck supple. Gross normal ROM               Cardiovascular: Normal rate and regular rhythm.                 Pulmonary/Chest: Effort normal and breath sounds without rales or wheezing.                 Abd:  Soft, NT, ND, + BS, no organomegaly               Neurological: Pt is alert. At baseline orientation, motor grossly intact               Skin: Skin is warm. No rashes, no other new lesions, LE edema - none               Psychiatric: Pt behavior is normal without agitation   Micro: none  Cardiac tracings I have personally interpreted today:  none  Pertinent Radiological findings (summarize): none   Lab Results  Component Value Date   WBC 6.1 06/07/2020   HGB 12.6 06/07/2020   HCT 37.5 06/07/2020   PLT 288.0 06/07/2020   GLUCOSE 103 (H) 06/07/2020   CHOL 193 06/07/2020   TRIG 108.0 06/07/2020   HDL 62.60 06/07/2020   LDLDIRECT 159.0 10/21/2012   LDLCALC 109 (H) 06/07/2020   ALT 17 06/07/2020   AST 19 06/07/2020   NA 141 06/07/2020   K 3.6 06/07/2020   CL 102 06/07/2020   CREATININE 0.66 06/07/2020   BUN 14 06/07/2020   CO2 30 06/07/2020   TSH 1.49 06/07/2020   HGBA1C 6.0 06/07/2020   Assessment/Plan:  Lindsey Boyd is a 55 y.o. Asian [4] female with  has a past medical history of Allergy, seasonal allergies, Hyperlipidemia, and Hyperlipidemia.  Encounter for well adult exam with abnormal findings Age and sex appropriate education and counseling updated with regular exercise and diet Referrals for preventative services - none needed Immunizations addressed - none needed Smoking counseling  - none needed Evidence for depression or other mood disorder - none significant Most recent labs reviewed. I have personally reviewed and have noted: 1) the patient's medical and social history 2) The patient's current medications and supplements 3) The patient's height, weight, and BMI have been recorded in the chart   Vitamin D deficiency Last vitamin D Lab Results  Component Value Date   VD25OH 35.24 06/07/2020  Stable, cont oral replacement   Snoring Mild worsening, for ENT referral r/o upper airway abnormal  Left facial pain I suspect left facial trigeminal  neuralgia, mild, currently resolved, but continue to monitor  Hyperlipidemia Lab Results  Component Value Date   LDLCALC 109 (H) 06/07/2020   Stable, pt to continue current statin crestor 20   Hyperglycemia Lab Results  Component Value Date   HGBA1C 6.0 06/07/2020   Stable, pt to continue current medical treatment  - diet   Followup: Return in about 1 year (around 06/07/2021).  Cathlean Cower, MD 06/14/2020 10:40 PM [facility]

## 2020-06-07 NOTE — Patient Instructions (Signed)
You will be contacted regarding the referral for: ENT  Please continue all other medications as before, and refills have been done if requested.  Please have the pharmacy call with any other refills you may need.  Please continue your efforts at being more active, low cholesterol diet, and weight control.  You are otherwise up to date with prevention measures today.  Please keep your appointments with your specialists as you may have planned  Please go to the LAB at the blood drawing area for the tests to be done  You will be contacted by phone if any changes need to be made immediately.  Otherwise, you will receive a letter about your results with an explanation, but please check with MyChart first.  Please remember to sign up for MyChart if you have not done so, as this will be important to you in the future with finding out test results, communicating by private email, and scheduling acute appointments online when needed.  Please make an Appointment to return for your 1 year visit, or sooner if needed, with Lab testing by Appointment as well, to be done about 3-5 days before at the [facility] (so this is for TWO appointments - please see the scheduling desk as you leave)   Due to the ongoing Covid 19 pandemic, our lab now requires an appointment for any labs done at our office.  If you need labs done and do not have an appointment, please call our office ahead of time to schedule before presenting to the lab for your testing.

## 2020-06-08 LAB — HEPATITIS C ANTIBODY
Hepatitis C Ab: NONREACTIVE
SIGNAL TO CUT-OFF: 0.01 (ref <1.00)

## 2020-06-14 ENCOUNTER — Encounter: Payer: Self-pay | Admitting: Internal Medicine

## 2020-06-14 NOTE — Assessment & Plan Note (Signed)
I suspect left facial trigeminal neuralgia, mild, currently resolved, but continue to monitor

## 2020-06-14 NOTE — Assessment & Plan Note (Signed)

## 2020-06-14 NOTE — Assessment & Plan Note (Signed)
Lab Results  Component Value Date   HGBA1C 6.0 06/07/2020   Stable, pt to continue current medical treatment  - diet

## 2020-06-14 NOTE — Assessment & Plan Note (Signed)
Mild worsening, for ENT referral r/o upper airway abnormal

## 2020-06-14 NOTE — Assessment & Plan Note (Signed)
Last vitamin D Lab Results  Component Value Date   VD25OH 35.24 06/07/2020   Stable, cont oral replacement

## 2020-06-14 NOTE — Assessment & Plan Note (Signed)
Lab Results  Component Value Date   LDLCALC 109 (H) 06/07/2020   Stable, pt to continue current statin crestor 20

## 2020-06-14 NOTE — Addendum Note (Signed)
Addended by: Biagio Borg on: 06/14/2020 10:41 PM   Modules accepted: Orders

## 2020-06-23 ENCOUNTER — Encounter: Payer: 59 | Admitting: Internal Medicine

## 2020-06-28 NOTE — Telephone Encounter (Signed)
Urology has called saying they sent over a fax back on May 20th saying they needed notes, labs, and urinary culture from the office to go with the referral   Phone: [phone] ext [phone] Fax: [phone]

## 2020-07-05 ENCOUNTER — Other Ambulatory Visit (HOSPITAL_COMMUNITY): Payer: Self-pay

## 2020-07-05 ENCOUNTER — Encounter: Payer: Self-pay | Admitting: Internal Medicine

## 2020-07-05 ENCOUNTER — Other Ambulatory Visit: Payer: Self-pay | Admitting: Internal Medicine

## 2020-07-05 MED ORDER — ROSUVASTATIN CALCIUM 20 MG PO TABS
ORAL_TABLET | ORAL | 0 refills | Status: DC
  Filled 2020-07-05: qty 30, 30d supply, fill #0

## 2020-07-08 ENCOUNTER — Other Ambulatory Visit (HOSPITAL_COMMUNITY): Payer: Self-pay

## 2020-07-08 MED ORDER — MOMETASONE FUROATE 50 MCG/ACT NA SUSP
NASAL | 11 refills | Status: DC
  Filled 2020-07-08 – 2021-05-21 (×2): qty 17, 30d supply, fill #0

## 2020-07-08 MED ORDER — OLOPATADINE HCL 0.1 % OP SOLN
1.0000 [drp] | Freq: Two times a day (BID) | OPHTHALMIC | 12 refills | Status: DC
  Filled 2020-07-08: qty 5, fill #0
  Filled 2021-04-12: qty 5, 50d supply, fill #0
  Filled 2021-05-21: qty 5, 25d supply, fill #1

## 2020-07-08 MED ORDER — ROSUVASTATIN CALCIUM 20 MG PO TABS
ORAL_TABLET | ORAL | 3 refills | Status: DC
  Filled 2020-07-08: qty 90, fill #0
  Filled 2020-07-08: qty 90, 90d supply, fill #0
  Filled 2020-10-27: qty 90, 90d supply, fill #1
  Filled 2021-02-13: qty 90, 90d supply, fill #2
  Filled 2021-05-21: qty 90, 90d supply, fill #3

## 2020-07-12 ENCOUNTER — Other Ambulatory Visit: Payer: Self-pay

## 2020-07-12 ENCOUNTER — Other Ambulatory Visit (HOSPITAL_COMMUNITY): Payer: Self-pay

## 2020-07-12 ENCOUNTER — Ambulatory Visit (INDEPENDENT_AMBULATORY_CARE_PROVIDER_SITE_OTHER): Payer: 59 | Admitting: Otolaryngology

## 2020-07-12 DIAGNOSIS — J310 Chronic rhinitis: Secondary | ICD-10-CM

## 2020-07-12 DIAGNOSIS — R0683 Snoring: Secondary | ICD-10-CM | POA: Diagnosis not present

## 2020-07-12 MED ORDER — TRIAMCINOLONE ACETONIDE 55 MCG/ACT NA AERO
INHALATION_SPRAY | NASAL | 12 refills | Status: DC
  Filled 2020-07-12 – 2021-05-21 (×2): qty 16.9, 30d supply, fill #0

## 2020-07-12 NOTE — Progress Notes (Signed)
HPI: Lindsey Boyd is a 55 y.o. female who presents is referred by by her PCP for evaluation of snoring.  She states that she has snored for several years now.  She feels like she sleeps well and does not wake up during the night.  She feels well rested when she wakes up in the morning.  She is checking this out mainly because of her husband who complains about her snoring.  She does not complain of difficulty breathing through her nose and she has used Flonase intermittently..  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 Married '96. 2 sons '00, '05. Work [employer]. No history of abuse. SO- [employer] Marriage in good health. Discussed ACP referred to [email or web address]   Social Determinants of Health   Financial Resource Strain: Not on file  Food Insecurity: Not on file  Transportation Needs: Not on file  Physical Activity: Not on file  Stress: Not on file  Social Connections: Not on file   Family History  Problem Relation Age of Onset   Diabetes Mother    Hyperlipidemia Mother    Hypertension Mother    Osteoporosis Mother    Hyperthyroidism Mother     Healthy Father    Hypothyroidism Sister    Cataracts Brother    Healthy Sister    Cataracts Brother    Cataracts Brother    Cancer Maternal Grandmother        breast   Breast cancer Maternal Grandmother    Cancer Maternal Grandfather        liver   Colon cancer Neg Hx    Rectal cancer Neg Hx    Stomach cancer Neg Hx    Esophageal cancer Neg Hx    Liver cancer Neg Hx    No Known Allergies Prior to Admission medications   Medication Sig Start Date End Date Taking? Authorizing Provider  clobetasol cream (TEMOVATE) 9.76 % Apply 1 application topically 2 (two) times a week.    [provider]  mometasone (NASONEX) 50 MCG/ACT nasal spray PLACE 2 SPRAYS IN EACH NOSTRIL ONCE A DAY 07/08/20   Biagio Borg, MD  olopatadine (PATANOL) 0.1 % ophthalmic solution PLACE 1 DROP IN BOTH EYES TWO TIMES DAILY 07/08/20   Biagio Borg, MD  rosuvastatin (CRESTOR) 20 MG tablet TAKE 1 TABLET (20 MG TOTAL) BY MOUTH DAILY. **THIS REPLACES SIMVASTATIN** 07/08/20 07/08/21  Biagio Borg, MD  Vitamin D, Ergocalciferol, (DRISDOL) 1.25 MG (50000 UNIT) CAPS capsule Take 1 capsule (50,000 Units  total) by mouth every 7 (seven) days. 02/27/19   Biagio Borg, MD     Positive ROS: Otherwise negative  All other systems have been reviewed and were otherwise negative with the exception of those mentioned in the HPI and as above.  Physical Exam: Constitutional: Alert, well-appearing, no acute distress.  Patient is not overweight. Ears: External ears without lesions or tenderness. Ear canals are clear bilaterally with intact, clear TMs.  Nasal: External nose without lesions. Septum with minimal deviation and mild rhinitis.  After decongesting the nose nasal passages otherwise clear with no polyps or obstructive lesions noted. Oral: Lips and gums without lesions. Tongue and palate mucosa without lesions. Posterior oropharynx clear.  She has a small posterior oropharynx with a moderate sized tongue base.  Tonsils are  small bilaterally.  Indirect laryngoscopy revealed a clear base of tongue vallecula and epiglottis.  Vocal cords were difficult to visualize. Neck: No palpable adenopathy or masses Respiratory: Breathing comfortably  Skin: No facial/neck lesions or rash noted.  Procedures  Assessment: Mild rhinitis with snoring. No clinical symptoms significant for obstructive sleep apnea  Plan: Reviewed with patient concerning snoring. Prescribed Nasacort to use regularly at night to help with nasal obstruction and better nasal breathing.  Reviewed with her concerning sleeping on her side that will also help reduce snoring. Also discussed with her concerning having her husband observe her snoring at night to see if she has any obstructive type symptoms which she states he has not mentioned.   Radene Journey, MD   CC:

## 2020-08-08 DIAGNOSIS — R3121 Asymptomatic microscopic hematuria: Secondary | ICD-10-CM | POA: Diagnosis not present

## 2020-08-15 DIAGNOSIS — K449 Diaphragmatic hernia without obstruction or gangrene: Secondary | ICD-10-CM | POA: Diagnosis not present

## 2020-08-15 DIAGNOSIS — R3129 Other microscopic hematuria: Secondary | ICD-10-CM | POA: Diagnosis not present

## 2020-08-15 DIAGNOSIS — N289 Disorder of kidney and ureter, unspecified: Secondary | ICD-10-CM | POA: Diagnosis not present

## 2020-08-15 DIAGNOSIS — R3121 Asymptomatic microscopic hematuria: Secondary | ICD-10-CM | POA: Diagnosis not present

## 2020-08-15 DIAGNOSIS — N200 Calculus of kidney: Secondary | ICD-10-CM | POA: Diagnosis not present

## 2020-08-18 DIAGNOSIS — R3121 Asymptomatic microscopic hematuria: Secondary | ICD-10-CM | POA: Diagnosis not present

## 2020-10-28 ENCOUNTER — Other Ambulatory Visit (HOSPITAL_COMMUNITY): Payer: Self-pay

## 2021-01-03 DIAGNOSIS — H5213 Myopia, bilateral: Secondary | ICD-10-CM | POA: Diagnosis not present

## 2021-01-03 DIAGNOSIS — H524 Presbyopia: Secondary | ICD-10-CM | POA: Diagnosis not present

## 2021-02-14 ENCOUNTER — Other Ambulatory Visit (HOSPITAL_COMMUNITY): Payer: Self-pay

## 2021-03-23 DIAGNOSIS — R3121 Asymptomatic microscopic hematuria: Secondary | ICD-10-CM | POA: Diagnosis not present

## 2021-03-27 ENCOUNTER — Encounter: Payer: Self-pay | Admitting: Internal Medicine

## 2021-03-27 DIAGNOSIS — H9312 Tinnitus, left ear: Secondary | ICD-10-CM

## 2021-03-27 DIAGNOSIS — R519 Headache, unspecified: Secondary | ICD-10-CM

## 2021-03-27 DIAGNOSIS — H9202 Otalgia, left ear: Secondary | ICD-10-CM

## 2021-03-30 NOTE — Telephone Encounter (Signed)
Ok referral to ent is done ?

## 2021-03-31 ENCOUNTER — Encounter: Payer: Self-pay | Admitting: *Deleted

## 2021-04-12 ENCOUNTER — Other Ambulatory Visit (HOSPITAL_BASED_OUTPATIENT_CLINIC_OR_DEPARTMENT_OTHER): Payer: Self-pay

## 2021-04-12 ENCOUNTER — Ambulatory Visit: Payer: 59 | Attending: Internal Medicine

## 2021-04-12 DIAGNOSIS — Z23 Encounter for immunization: Secondary | ICD-10-CM

## 2021-04-12 NOTE — Progress Notes (Signed)
? ?  Covid-19 Vaccination Clinic ? ?Name:  RUDY DOMEK    ?MRN: [id number] ?DOB: [birth year] ? ?04/12/2021 ? ?Ms. Langford was observed post Covid-19 immunization for 15 minutes without incident. She was provided with Vaccine Information Sheet and instruction to access the V-Safe system.  ? ?Ms. Catena was instructed to call 911 with any severe reactions post vaccine: ?Difficulty breathing  ?Swelling of face and throat  ?A fast heartbeat  ?A bad rash all over body  ?Dizziness and weakness  ? ?Immunizations Administered   ? ? Name Date Dose VIS Date Route  ? Ambulance person Booster 04/12/2021 11:16 AM 0.3 mL 09/28/2020 Intramuscular  ? Manufacturer: [place]: [phone]  ? [place]: [phone]  ? ?  ? ? ?

## 2021-04-27 ENCOUNTER — Other Ambulatory Visit (HOSPITAL_BASED_OUTPATIENT_CLINIC_OR_DEPARTMENT_OTHER): Payer: Self-pay

## 2021-04-27 MED ORDER — PFIZER COVID-19 VAC BIVALENT 30 MCG/0.3ML IM SUSP
INTRAMUSCULAR | 0 refills | Status: DC
  Filled 2021-04-27: qty 0.3, 1d supply, fill #0

## 2021-05-21 ENCOUNTER — Other Ambulatory Visit: Payer: Self-pay | Admitting: Internal Medicine

## 2021-05-21 MED ORDER — CLOBETASOL PROPIONATE 0.05 % EX CREA
1.0000 "application " | TOPICAL_CREAM | CUTANEOUS | 0 refills | Status: AC
  Filled 2021-05-21: qty 30, 28d supply, fill #0

## 2021-05-22 ENCOUNTER — Other Ambulatory Visit (HOSPITAL_COMMUNITY): Payer: Self-pay

## 2021-05-24 ENCOUNTER — Other Ambulatory Visit (HOSPITAL_COMMUNITY): Payer: Self-pay

## 2021-05-26 ENCOUNTER — Other Ambulatory Visit (HOSPITAL_COMMUNITY): Payer: Self-pay

## 2021-06-08 ENCOUNTER — Encounter: Payer: Self-pay | Admitting: Internal Medicine

## 2021-06-08 ENCOUNTER — Ambulatory Visit (INDEPENDENT_AMBULATORY_CARE_PROVIDER_SITE_OTHER): Payer: 59 | Admitting: Internal Medicine

## 2021-06-08 VITALS — BP 108/56 | HR 66 | Temp 98.0°F | Ht 59.75 in | Wt 112.4 lb

## 2021-06-08 DIAGNOSIS — Z0001 Encounter for general adult medical examination with abnormal findings: Secondary | ICD-10-CM | POA: Diagnosis not present

## 2021-06-08 DIAGNOSIS — E538 Deficiency of other specified B group vitamins: Secondary | ICD-10-CM | POA: Diagnosis not present

## 2021-06-08 DIAGNOSIS — E7800 Pure hypercholesterolemia, unspecified: Secondary | ICD-10-CM | POA: Diagnosis not present

## 2021-06-08 DIAGNOSIS — E559 Vitamin D deficiency, unspecified: Secondary | ICD-10-CM

## 2021-06-08 DIAGNOSIS — R739 Hyperglycemia, unspecified: Secondary | ICD-10-CM | POA: Diagnosis not present

## 2021-06-08 DIAGNOSIS — H93A2 Pulsatile tinnitus, left ear: Secondary | ICD-10-CM | POA: Diagnosis not present

## 2021-06-08 DIAGNOSIS — R002 Palpitations: Secondary | ICD-10-CM | POA: Diagnosis not present

## 2021-06-08 LAB — BASIC METABOLIC PANEL
BUN: 18 mg/dL (ref 6–23)
CO2: 28 mEq/L (ref 19–32)
Calcium: 9.7 mg/dL (ref 8.4–10.5)
Chloride: 104 mEq/L (ref 96–112)
Creatinine, Ser: 0.71 mg/dL (ref 0.40–1.20)
GFR: 95.31 mL/min (ref >60.00)
Glucose, Bld: 106 mg/dL — ABNORMAL HIGH (ref 70–99)
Potassium: 4.4 mEq/L (ref 3.5–5.1)
Sodium: 139 mEq/L (ref 135–145)

## 2021-06-08 LAB — CBC WITH DIFFERENTIAL/PLATELET
Basophils Absolute: 0 10*3/uL (ref 0.0–0.1)
Basophils Relative: 0.4 % (ref 0.0–3.0)
Eosinophils Absolute: 0.3 10*3/uL (ref 0.0–0.7)
Eosinophils Relative: 4.2 % (ref 0.0–5.0)
HCT: 37.4 % (ref 36.0–46.0)
Hemoglobin: 12.5 g/dL (ref 12.0–15.0)
Lymphocytes Relative: 31.2 % (ref 12.0–46.0)
Lymphs Abs: 2 10*3/uL (ref 0.7–4.0)
MCHC: 33.5 g/dL (ref 30.0–36.0)
MCV: 84.8 fl (ref 78.0–100.0)
Monocytes Absolute: 0.5 10*3/uL (ref 0.1–1.0)
Monocytes Relative: 8.5 % (ref 3.0–12.0)
Neutro Abs: 3.6 10*3/uL (ref 1.4–7.7)
Neutrophils Relative %: 55.7 % (ref 43.0–77.0)
Platelets: 276 10*3/uL (ref 150.0–400.0)
RBC: 4.41 Mil/uL (ref 3.87–5.11)
RDW: 13.8 % (ref 11.5–15.5)
WBC: 6.5 10*3/uL (ref 4.0–10.5)

## 2021-06-08 LAB — VITAMIN B12: Vitamin B-12: 462 pg/mL (ref 211–911)

## 2021-06-08 LAB — LIPID PANEL
Cholesterol: 184 mg/dL (ref 0–200)
HDL: 64.7 mg/dL (ref >39.00)
LDL Cholesterol: 100 mg/dL — ABNORMAL HIGH (ref 0–99)
NonHDL: 119.25
Total CHOL/HDL Ratio: 3
Triglycerides: 98 mg/dL (ref 0.0–149.0)
VLDL: 19.6 mg/dL (ref 0.0–40.0)

## 2021-06-08 LAB — URINALYSIS, ROUTINE W REFLEX MICROSCOPIC
Bilirubin Urine: NEGATIVE
Ketones, ur: NEGATIVE
Leukocytes,Ua: NEGATIVE
Nitrite: NEGATIVE
Specific Gravity, Urine: 1.02 (ref 1.000–1.030)
Total Protein, Urine: NEGATIVE
Urine Glucose: NEGATIVE
Urobilinogen, UA: 0.2 (ref 0.0–1.0)
pH: 6 (ref 5.0–8.0)

## 2021-06-08 LAB — TSH: TSH: 0.98 u[IU]/mL (ref 0.35–5.50)

## 2021-06-08 LAB — HEPATIC FUNCTION PANEL
ALT: 15 U/L (ref 0–35)
AST: 17 U/L (ref 0–37)
Albumin: 4.6 g/dL (ref 3.5–5.2)
Alkaline Phosphatase: 61 U/L (ref 39–117)
Bilirubin, Direct: 0.1 mg/dL (ref 0.0–0.3)
Total Bilirubin: 0.5 mg/dL (ref 0.2–1.2)
Total Protein: 7.8 g/dL (ref 6.0–8.3)

## 2021-06-08 LAB — VITAMIN D 25 HYDROXY (VIT D DEFICIENCY, FRACTURES): VITD: 33.8 ng/mL (ref 30.00–100.00)

## 2021-06-08 LAB — HEMOGLOBIN A1C: Hgb A1c MFr Bld: 5.9 % (ref 4.6–6.5)

## 2021-06-08 NOTE — Patient Instructions (Signed)
Please continue all other medications as before, and refills have been done if requested. ? ?Please have the pharmacy call with any other refills you may need. ? ?Please continue your efforts at being more active, low cholesterol diet, and weight control. ? ?You are otherwise up to date with prevention measures today. ? ?Please keep your appointments with your specialists as you may have planned  ENT soon for the left ear tinnitus ? ?We have discussed the Cardiac CT Score test to measure the calcification level (if any) in your heart arteries.  This test has been ordered in our [facility], so please call [facility] directly, as they prefer this, at [phone] to be scheduled. ? ?You will be contacted regarding the referral for: Cardiac Event monitor ? ?Please go to the LAB at the blood drawing area for the tests to be done ? ?You will be contacted by phone if any changes need to be made immediately.  Otherwise, you will receive a letter about your results with an explanation, but please check with MyChart first. ? ?Please remember to sign up for MyChart if you have not done so, as this will be important to you in the future with finding out test results, communicating by private email, and scheduling acute appointments online when needed. ? ?Please make an Appointment to return for your 1 year visit, or sooner if needed ?

## 2021-06-08 NOTE — Progress Notes (Signed)
Patient ID: [name], female   DOB: Feb 19, 1965, 56 y.o.   MRN: [id number] ? ? ? ?     Chief Complaint:: wellness exam and low vit d, hld, palpitations, left tiinnitus ? ?     HPI:  GILLIAN MEEUWSEN is a 56 y.o. female here for wellness exam; decliens shingrix for now, o/w up to date ?         ?              Also has persistent left tinnitus to left ear for 2 months, has appt with ENT in June 2023.  Pt denies new neurological symptoms such as new headache, or facial or extremity weakness or numbness  Taking Vit D 2000 u qd Is interested in cardiac risk evaluation with Cardiac CT score testing.  Also has palpitations mild intermittent new onset from last 3 mo or so, Pt denies chest pain, increased sob or doe, wheezing, orthopnea, PND, increased LE swelling, dizziness or syncope, but is concerned about risk of afib due to family history.  This will even wake her up at night, last minutes at a time.   Pt denies polydipsia, polyuria, or new focal neuro s/s.    Pt denies fever, wt loss, night sweats, loss of appetite, or other constitutional symptoms  ?  ?Wt Readings from Last 3 Encounters:  ?06/08/21 112 lb 6.4 oz (51 kg)  ?06/07/20 111 lb 3.2 oz (50.4 kg)  ?05/11/19 108 lb (49 kg)  ? ?BP Readings from Last 3 Encounters:  ?06/08/21 (!) 108/56  ?06/07/20 102/68  ?05/11/19 (!) 91/57  ? ?Immunization History  ?Administered Date(s) Administered  ? Influenza,inj,Quad PF,6+ Mos 10/19/2015  ? Influenza-Unspecified 10/30/2018, 11/10/2020  ? PFIZER(Purple Top)SARS-COV-2 Vaccination 02/17/2019, 03/10/2019, 01/01/2020  ? Pension scheme manager 77yr & up 04/12/2021  ? Tdap 04/10/2011, 04/12/2021  ? ?There are no preventive care reminders to display for this patient. ? ?  ? ?Past Medical History:  ?Diagnosis Date  ? Allergy   ? Hx of seasonal allergies   ? Hyperlipidemia   ? Hyperlipidemia   ? ?Past Surgical History:  ?Procedure Laterality Date  ? None    ? ? reports that she has never smoked. She has never used smokeless  tobacco. She reports that she does not drink alcohol and does not use drugs. ?family history includes Breast cancer in her maternal grandmother; Cancer in her maternal grandfather and maternal grandmother; Cataracts in her brother, brother, and brother; Diabetes in her mother; Healthy in her father and sister; Hyperlipidemia in her mother; Hypertension in her mother; Hyperthyroidism in her mother; Hypothyroidism in her sister; Osteoporosis in her mother. ?No Known Allergies ?Current Outpatient Medications on File Prior to Visit  ?Medication Sig Dispense Refill  ? Cholecalciferol (VITAMIN D3) 50 MCG (2000 UT) TABS Take by mouth.    ? clobetasol cream (TEMOVATE) 06.25% Apply 1 application. topically 2 (two) times a week. 30 g 0  ? mometasone (NASONEX) 50 MCG/ACT nasal spray PLACE 2 SPRAYS IN EACH NOSTRIL ONCE A DAY 17 g 11  ? olopatadine (PATANOL) 0.1 % ophthalmic solution Place 1 drop into both eyes 2 (two) times daily. 5 mL 12  ? rosuvastatin (CRESTOR) 20 MG tablet TAKE 1 TABLET (20 MG TOTAL) BY MOUTH DAILY. **THIS REPLACES SIMVASTATIN** 90 tablet 3  ? triamcinolone (NASACORT ALLERGY 24HR CHILDREN) 55 MCG/ACT AERO nasal inhaler Place 2 sprays into each nostril at night 16.9 mL 12  ? Vitamin D, Ergocalciferol, (DRISDOL) 1.25 MG (  50000 UNIT) CAPS capsule Take 1 capsule (50,000 Units total) by mouth every 7 (seven) days. (Patient not taking: Reported on 06/08/2021) 12 capsule 0  ? ?No current facility-administered medications on file prior to visit.  ? ?     ROS:  All others reviewed and negative. ? ?Objective  ? ?     PE:  BP (!) 108/56 (BP Location: Right Arm, Patient Position: Sitting, Cuff Size: Normal)   Pulse 66   Temp 98 ?F (36.7 ?C) (Oral)   Ht 4' 11.75" (1.518 m)   Wt 112 lb 6.4 oz (51 kg)   LMP 03/30/2011   SpO2 99%   BMI 22.14 kg/m?  ? ?              Constitutional: Pt appears in NAD ?              HENT: Head: NCAT.  ?              Right Ear: External ear normal.   ?              Left Ear: External ear  normal.  ?              Eyes: . Pupils are equal, round, and reactive to light. Conjunctivae and EOM are normal ?              Nose: without d/c or deformity ?              Neck: Neck supple. Gross normal ROM ?              Cardiovascular: Normal rate and regular rhythm.   ?              Pulmonary/Chest: Effort normal and breath sounds without rales or wheezing.  ?              Abd:  Soft, NT, ND, + BS, no organomegaly ?              Neurological: Pt is alert. At baseline orientation, motor grossly intact ?              Skin: Skin is warm. No rashes, no other new lesions, LE edema - none ?              Psychiatric: Pt behavior is normal without agitation  ? ?Micro: none ? ?Cardiac tracings I have personally interpreted today:  none ? ?Pertinent Radiological findings (summarize): none  ? ?Lab Results  ?Component Value Date  ? WBC 6.5 06/08/2021  ? HGB 12.5 06/08/2021  ? HCT 37.4 06/08/2021  ? PLT 276.0 06/08/2021  ? GLUCOSE 106 (H) 06/08/2021  ? CHOL 184 06/08/2021  ? TRIG 98.0 06/08/2021  ? HDL 64.70 06/08/2021  ? LDLDIRECT 159.0 10/21/2012  ? LDLCALC 100 (H) 06/08/2021  ? ALT 15 06/08/2021  ? AST 17 06/08/2021  ? NA 139 06/08/2021  ? K 4.4 06/08/2021  ? CL 104 06/08/2021  ? CREATININE 0.71 06/08/2021  ? BUN 18 06/08/2021  ? CO2 28 06/08/2021  ? TSH 0.98 06/08/2021  ? HGBA1C 5.9 06/08/2021  ? ?Assessment/Plan:  ?ELINOR KLEINE is a 56 y.o. Asian [4] female with  has a past medical history of Allergy, seasonal allergies, Hyperlipidemia, and Hyperlipidemia. ? ?Encounter for well adult exam with abnormal findings ?Age and sex appropriate education and counseling updated with regular exercise and diet ?Referrals for preventative services - none needed ?Immunizations addressed - declines shingrix ?Smoking  counseling  - none needed ?Evidence for depression or other mood disorder - none significant ?Most recent labs reviewed. ?I have personally reviewed and have noted: ?1) the patient's medical and social history ?2) The  patient's current medications and supplements ?3) The patient's height, weight, and BMI have been recorded in the chart ? ?Hyperglycemia ?Lab Results  ?Component Value Date  ? HGBA1C 5.9 06/08/2021  ? ?Stable, pt to continue current medical treatment  - diet ? ? ?Hyperlipidemia ?Lab Results  ?Component Value Date  ? LDLCALC 100 (H) 06/08/2021  ? ?Stable, pt to continue current statin crestor 20, but also for cardiac CT score, if abnormal this would change goal ldl to < 70  ? ? ?Palpitations ?Exam benign today, will need cardiac event monitor r/o afib vs other ? ?Vitamin D deficiency ?Last vitamin D ?Lab Results  ?Component Value Date  ? VD25OH 33.80 06/08/2021  ? ?Low, to start oral replacement ? ? ?Pulsatile tinnitus of left ear ?Exam benign, pt has appt f/u with ENT in June 2023 ?Followup: Return in about 1 year (around 06/09/2022). ? ?Cathlean Cower, MD 06/11/2021 2:55 PM ?[facility] ?[place] ?Internal Medicine ?

## 2021-06-11 ENCOUNTER — Encounter: Payer: Self-pay | Admitting: Internal Medicine

## 2021-06-11 NOTE — Assessment & Plan Note (Signed)
Lab Results  ?Component Value Date  ? HGBA1C 5.9 06/08/2021  ? ?Stable, pt to continue current medical treatment  - diet ? ?

## 2021-06-11 NOTE — Assessment & Plan Note (Signed)
Last vitamin D ?Lab Results  ?Component Value Date  ? VD25OH 33.80 06/08/2021  ? ?Low, to start oral replacement ? ?

## 2021-06-11 NOTE — Assessment & Plan Note (Signed)

## 2021-06-11 NOTE — Assessment & Plan Note (Signed)
Lab Results  ?Component Value Date  ? LDLCALC 100 (H) 06/08/2021  ? ?Stable, pt to continue current statin crestor 20, but also for cardiac CT score, if abnormal this would change goal ldl to < 70  ? ?

## 2021-06-11 NOTE — Assessment & Plan Note (Signed)
Exam benign today, will need cardiac event monitor r/o afib vs other ?

## 2021-06-11 NOTE — Assessment & Plan Note (Signed)
Exam benign, pt has appt f/u with ENT in June 2023 ?

## 2021-07-03 ENCOUNTER — Ambulatory Visit (INDEPENDENT_AMBULATORY_CARE_PROVIDER_SITE_OTHER): Payer: 59

## 2021-07-03 DIAGNOSIS — R002 Palpitations: Secondary | ICD-10-CM

## 2021-07-10 DIAGNOSIS — H9042 Sensorineural hearing loss, unilateral, left ear, with unrestricted hearing on the contralateral side: Secondary | ICD-10-CM | POA: Diagnosis not present

## 2021-07-10 DIAGNOSIS — H9312 Tinnitus, left ear: Secondary | ICD-10-CM | POA: Diagnosis not present

## 2021-07-11 ENCOUNTER — Other Ambulatory Visit (HOSPITAL_COMMUNITY): Payer: Self-pay | Admitting: Otolaryngology

## 2021-07-11 ENCOUNTER — Other Ambulatory Visit: Payer: Self-pay | Admitting: Otolaryngology

## 2021-07-11 DIAGNOSIS — H9042 Sensorineural hearing loss, unilateral, left ear, with unrestricted hearing on the contralateral side: Secondary | ICD-10-CM

## 2021-07-26 ENCOUNTER — Ambulatory Visit (HOSPITAL_COMMUNITY)
Admission: RE | Admit: 2021-07-26 | Discharge: 2021-07-26 | Disposition: A | Discharge status: Home / Self Care | Payer: 59 | Source: Ambulatory Visit | Attending: Otolaryngology | Admitting: Otolaryngology

## 2021-07-26 ENCOUNTER — Other Ambulatory Visit (HOSPITAL_COMMUNITY): Payer: Self-pay | Admitting: Otolaryngology

## 2021-07-26 DIAGNOSIS — H9042 Sensorineural hearing loss, unilateral, left ear, with unrestricted hearing on the contralateral side: Secondary | ICD-10-CM | POA: Diagnosis not present

## 2021-07-26 DIAGNOSIS — J341 Cyst and mucocele of nose and nasal sinus: Secondary | ICD-10-CM | POA: Diagnosis not present

## 2021-07-26 MED ORDER — GADOBUTROL 1 MMOL/ML IV SOLN
5.0000 mL | Freq: Once | INTRAVENOUS | Status: AC | PRN
  Administered 2021-07-26: 5 mL via INTRAVENOUS

## 2021-08-07 ENCOUNTER — Other Ambulatory Visit (HOSPITAL_BASED_OUTPATIENT_CLINIC_OR_DEPARTMENT_OTHER): Payer: 59

## 2021-09-04 DIAGNOSIS — R3121 Asymptomatic microscopic hematuria: Secondary | ICD-10-CM | POA: Diagnosis not present

## 2021-10-07 ENCOUNTER — Other Ambulatory Visit: Payer: Self-pay | Admitting: Internal Medicine

## 2021-10-07 ENCOUNTER — Encounter: Payer: Self-pay | Admitting: Internal Medicine

## 2021-10-07 MED ORDER — OLOPATADINE HCL 0.1 % OP SOLN
1.0000 [drp] | Freq: Two times a day (BID) | OPHTHALMIC | 12 refills | Status: DC
  Filled 2021-10-07: qty 5, 50d supply, fill #0
  Filled 2022-01-16 – 2022-01-17 (×2): qty 5, 50d supply, fill #1

## 2021-10-07 MED ORDER — ROSUVASTATIN CALCIUM 20 MG PO TABS
ORAL_TABLET | ORAL | 2 refills | Status: DC
  Filled 2021-10-07: qty 90, 90d supply, fill #0
  Filled 2022-01-16 – 2022-01-17 (×2): qty 90, 90d supply, fill #1
  Filled 2022-04-18: qty 90, 90d supply, fill #2

## 2021-10-07 MED ORDER — MOMETASONE FUROATE 50 MCG/ACT NA SUSP
NASAL | 11 refills | Status: DC
  Filled 2021-10-07: qty 17, 30d supply, fill #0

## 2021-10-09 ENCOUNTER — Other Ambulatory Visit (HOSPITAL_BASED_OUTPATIENT_CLINIC_OR_DEPARTMENT_OTHER): Payer: Self-pay

## 2021-10-09 ENCOUNTER — Other Ambulatory Visit: Payer: Self-pay | Admitting: Internal Medicine

## 2021-10-09 ENCOUNTER — Other Ambulatory Visit (HOSPITAL_COMMUNITY): Payer: Self-pay

## 2021-10-09 MED ORDER — MOMETASONE FUROATE 50 MCG/ACT NA SUSP
NASAL | 11 refills | Status: AC
  Filled 2021-10-09 – 2022-01-17 (×3): qty 17, 30d supply, fill #0
  Filled 2022-01-17: qty 17, 21d supply, fill #0

## 2021-10-09 MED ORDER — FLUTICASONE PROPIONATE 50 MCG/ACT NA SUSP
2.0000 | Freq: Every day | NASAL | 11 refills | Status: DC
  Filled 2021-10-09 (×2): qty 16, 30d supply, fill #0

## 2021-10-12 ENCOUNTER — Other Ambulatory Visit (HOSPITAL_COMMUNITY): Payer: Self-pay

## 2021-10-18 ENCOUNTER — Other Ambulatory Visit (HOSPITAL_COMMUNITY): Payer: Self-pay

## 2022-01-09 DIAGNOSIS — H5213 Myopia, bilateral: Secondary | ICD-10-CM | POA: Diagnosis not present

## 2022-01-09 DIAGNOSIS — H524 Presbyopia: Secondary | ICD-10-CM | POA: Diagnosis not present

## 2022-01-17 ENCOUNTER — Other Ambulatory Visit (HOSPITAL_BASED_OUTPATIENT_CLINIC_OR_DEPARTMENT_OTHER): Payer: Self-pay

## 2022-01-17 ENCOUNTER — Other Ambulatory Visit: Payer: Self-pay | Admitting: Internal Medicine

## 2022-01-17 MED ORDER — TRIAMCINOLONE ACETONIDE 55 MCG/ACT NA AERO
2.0000 | INHALATION_SPRAY | Freq: Every day | NASAL | 3 refills | Status: AC
  Filled 2022-01-17: qty 50.7, 90d supply, fill #0

## 2022-01-17 NOTE — Telephone Encounter (Signed)
Ok to change to [employer] asd

## 2022-01-18 ENCOUNTER — Other Ambulatory Visit: Payer: Self-pay

## 2022-01-18 ENCOUNTER — Other Ambulatory Visit (HOSPITAL_COMMUNITY): Payer: Self-pay

## 2022-01-24 ENCOUNTER — Other Ambulatory Visit (HOSPITAL_BASED_OUTPATIENT_CLINIC_OR_DEPARTMENT_OTHER): Payer: Self-pay

## 2022-01-25 ENCOUNTER — Other Ambulatory Visit (HOSPITAL_BASED_OUTPATIENT_CLINIC_OR_DEPARTMENT_OTHER): Payer: Self-pay

## 2022-04-18 ENCOUNTER — Other Ambulatory Visit: Payer: Self-pay

## 2022-06-13 ENCOUNTER — Encounter: Payer: Self-pay | Admitting: Pharmacist

## 2022-06-13 ENCOUNTER — Other Ambulatory Visit: Payer: Self-pay

## 2022-06-13 ENCOUNTER — Other Ambulatory Visit (HOSPITAL_COMMUNITY): Payer: Self-pay

## 2022-06-13 ENCOUNTER — Ambulatory Visit: Payer: Commercial Managed Care - PPO | Admitting: Internal Medicine

## 2022-06-13 VITALS — BP 94/60 | HR 70 | Temp 97.6°F | Ht 59.75 in | Wt 114.2 lb

## 2022-06-13 DIAGNOSIS — E538 Deficiency of other specified B group vitamins: Secondary | ICD-10-CM

## 2022-06-13 DIAGNOSIS — Z7184 Encounter for health counseling related to travel: Secondary | ICD-10-CM

## 2022-06-13 DIAGNOSIS — Z20828 Contact with and (suspected) exposure to other viral communicable diseases: Secondary | ICD-10-CM | POA: Diagnosis not present

## 2022-06-13 DIAGNOSIS — E7800 Pure hypercholesterolemia, unspecified: Secondary | ICD-10-CM

## 2022-06-13 DIAGNOSIS — E559 Vitamin D deficiency, unspecified: Secondary | ICD-10-CM

## 2022-06-13 DIAGNOSIS — R739 Hyperglycemia, unspecified: Secondary | ICD-10-CM | POA: Diagnosis not present

## 2022-06-13 DIAGNOSIS — Z0001 Encounter for general adult medical examination with abnormal findings: Secondary | ICD-10-CM

## 2022-06-13 LAB — BASIC METABOLIC PANEL
BUN: 15 mg/dL (ref 6–23)
CO2: 29 mEq/L (ref 19–32)
Calcium: 9.8 mg/dL (ref 8.4–10.5)
Chloride: 103 mEq/L (ref 96–112)
Creatinine, Ser: 0.66 mg/dL (ref 0.40–1.20)
GFR: 97.63 mL/min (ref >60.00)
Glucose, Bld: 102 mg/dL — ABNORMAL HIGH (ref 70–99)
Potassium: 4.3 mEq/L (ref 3.5–5.1)
Sodium: 141 mEq/L (ref 135–145)

## 2022-06-13 LAB — URINALYSIS, ROUTINE W REFLEX MICROSCOPIC
Bilirubin Urine: NEGATIVE
Ketones, ur: NEGATIVE
Leukocytes,Ua: NEGATIVE
Nitrite: NEGATIVE
Specific Gravity, Urine: 1.025 (ref 1.000–1.030)
Total Protein, Urine: NEGATIVE
Urine Glucose: NEGATIVE
Urobilinogen, UA: 0.2 (ref 0.0–1.0)
pH: 6 (ref 5.0–8.0)

## 2022-06-13 LAB — CBC WITH DIFFERENTIAL/PLATELET
Basophils Absolute: 0 10*3/uL (ref 0.0–0.1)
Basophils Relative: 0.5 % (ref 0.0–3.0)
Eosinophils Absolute: 0.3 10*3/uL (ref 0.0–0.7)
Eosinophils Relative: 4.9 % (ref 0.0–5.0)
HCT: 38.6 % (ref 36.0–46.0)
Hemoglobin: 12.9 g/dL (ref 12.0–15.0)
Lymphocytes Relative: 27 % (ref 12.0–46.0)
Lymphs Abs: 1.8 10*3/uL (ref 0.7–4.0)
MCHC: 33.4 g/dL (ref 30.0–36.0)
MCV: 84.8 fl (ref 78.0–100.0)
Monocytes Absolute: 0.5 10*3/uL (ref 0.1–1.0)
Monocytes Relative: 7.1 % (ref 3.0–12.0)
Neutro Abs: 4 10*3/uL (ref 1.4–7.7)
Neutrophils Relative %: 60.5 % (ref 43.0–77.0)
Platelets: 309 10*3/uL (ref 150.0–400.0)
RBC: 4.55 Mil/uL (ref 3.87–5.11)
RDW: 14.1 % (ref 11.5–15.5)
WBC: 6.5 10*3/uL (ref 4.0–10.5)

## 2022-06-13 LAB — HEPATIC FUNCTION PANEL
ALT: 18 U/L (ref 0–35)
AST: 21 U/L (ref 0–37)
Albumin: 4.6 g/dL (ref 3.5–5.2)
Alkaline Phosphatase: 65 U/L (ref 39–117)
Bilirubin, Direct: 0.1 mg/dL (ref 0.0–0.3)
Total Bilirubin: 0.5 mg/dL (ref 0.2–1.2)
Total Protein: 8 g/dL (ref 6.0–8.3)

## 2022-06-13 LAB — LIPID PANEL
Cholesterol: 175 mg/dL (ref 0–200)
HDL: 64.5 mg/dL (ref >39.00)
LDL Cholesterol: 94 mg/dL (ref 0–99)
NonHDL: 110.61
Total CHOL/HDL Ratio: 3
Triglycerides: 84 mg/dL (ref 0.0–149.0)
VLDL: 16.8 mg/dL (ref 0.0–40.0)

## 2022-06-13 LAB — VITAMIN D 25 HYDROXY (VIT D DEFICIENCY, FRACTURES): VITD: 52.6 ng/mL (ref 30.00–100.00)

## 2022-06-13 LAB — HEMOGLOBIN A1C: Hgb A1c MFr Bld: 6 % (ref 4.6–6.5)

## 2022-06-13 LAB — VITAMIN B12: Vitamin B-12: 521 pg/mL (ref 211–911)

## 2022-06-13 LAB — TSH: TSH: 0.89 u[IU]/mL (ref 0.35–5.50)

## 2022-06-13 MED ORDER — ROSUVASTATIN CALCIUM 20 MG PO TABS
20.0000 mg | ORAL_TABLET | Freq: Every day | ORAL | 3 refills | Status: DC
  Filled 2022-06-13: qty 90, fill #0

## 2022-06-13 MED ORDER — OLOPATADINE HCL 0.1 % OP SOLN
1.0000 [drp] | Freq: Two times a day (BID) | OPHTHALMIC | 12 refills | Status: DC
  Filled 2022-06-13 – 2022-08-22 (×2): qty 5, 50d supply, fill #0
  Filled 2023-03-29: qty 5, 50d supply, fill #1

## 2022-06-13 MED ORDER — FLUTICASONE PROPIONATE 50 MCG/ACT NA SUSP
2.0000 | Freq: Every day | NASAL | 11 refills | Status: AC
  Filled 2022-06-13: qty 16, 30d supply, fill #0

## 2022-06-13 MED ORDER — ROSUVASTATIN CALCIUM 20 MG PO TABS
20.0000 mg | ORAL_TABLET | Freq: Every day | ORAL | 3 refills | Status: DC
  Filled 2022-06-13 – 2022-08-22 (×2): qty 90, 90d supply, fill #0
  Filled 2022-11-19: qty 90, 90d supply, fill #1
  Filled 2023-03-26: qty 90, 90d supply, fill #2
  Filled 2023-03-29: qty 90, 90d supply, fill #0

## 2022-06-13 NOTE — Progress Notes (Signed)
Patient ID: Lindsey Boyd, female   DOB: September 28, 1965, 57 y.o.   MRN: [id number]         Chief Complaint:: wellness exam and hld, low vit d, travel medicine, hyperglycemia       HPI:  Lindsey Boyd is a 57 y.o. female here for wellness exam; plans to see Gyn for pap mammogram soon; for shingrix at pharmacy, declines covid booster, o/w up to date                        Also Pt denies chest pain, increased sob or doe, wheezing, orthopnea, PND, increased LE swelling, palpitations, dizziness or syncope.   Pt denies polydipsia, polyuria, or new focal neuro s/s.    Pt denies fever, wt loss, night sweats, loss of appetite, or other constitutional symptoms  Plans for trip to  [place] to see family soon, needs to determine Hep A immune statu, and for immunization if negative.     Wt Readings from Last 3 Encounters:  06/13/22 114 lb 4 oz (51.8 kg)  06/08/21 112 lb 6.4 oz (51 kg)  06/07/20 111 lb 3.2 oz (50.4 kg)   BP Readings from Last 3 Encounters:  06/13/22 94/60  06/08/21 (!) 108/56  06/07/20 102/68   Immunization History  Administered Date(s) Administered   Influenza,inj,Quad PF,6+ Mos 10/19/2015   Influenza-Unspecified 10/30/2018, 11/10/2020   PFIZER(Purple Top)SARS-COV-2 Vaccination 02/17/2019, 03/10/2019, 01/01/2020   Pfizer Covid-19 Vaccine Bivalent Booster 64yrs & up 04/12/2021   Tdap 04/10/2011, 04/12/2021   There are no preventive care reminders to display for this patient.     Past Medical History:  Diagnosis Date   Allergy    Hx of seasonal allergies    Hyperlipidemia    Hyperlipidemia    Past Surgical History:  Procedure Laterality Date   None      reports that she has never smoked. She has never used smokeless tobacco. She reports that she does not drink alcohol and does not use drugs. family history includes Breast cancer in her maternal grandmother; Cancer in her maternal grandfather and maternal grandmother; Cataracts in her brother, brother, and brother; Diabetes in her  mother; Healthy in her father and sister; Hyperlipidemia in her mother; Hypertension in her mother; Hyperthyroidism in her mother; Hypothyroidism in her sister; Osteoporosis in her mother. No Known Allergies Current Outpatient Medications on File Prior to Visit  Medication Sig Dispense Refill   Cholecalciferol (VITAMIN D3) 50 MCG (2000 UT) TABS Take by mouth.     clobetasol cream (TEMOVATE) 0.05 % Apply 1 application. topically 2 (two) times a week. 30 g 0   mometasone (NASONEX) 50 MCG/ACT nasal spray PLACE 2 SPRAYS IN EACH NOSTRIL ONCE A DAY 17 g 11   triamcinolone (NASACORT) 55 MCG/ACT AERO nasal inhaler Place 2 sprays into the nose daily. 50.7 mL 3   Vitamin D, Ergocalciferol, (DRISDOL) 1.25 MG (50000 UNIT) CAPS capsule Take 1 capsule (50,000 Units total) by mouth every 7 (seven) days. 12 capsule 0   No current facility-administered medications on file prior to visit.        ROS:  All others reviewed and negative.  Objective        PE:  BP 94/60   Pulse 70   Temp 97.6 F (36.4 C) (Temporal)   Ht 4' 11.75" (1.518 m)   Wt 114 lb 4 oz (51.8 kg)   LMP 03/30/2011   SpO2 98%   BMI 22.50 kg/m  Constitutional: Pt appears in NAD               HENT: Head: NCAT.                Right Ear: External ear normal.                 Left Ear: External ear normal.                Eyes: . Pupils are equal, round, and reactive to light. Conjunctivae and EOM are normal               Nose: without d/c or deformity               Neck: Neck supple. Gross normal ROM               Cardiovascular: Normal rate and regular rhythm.                 Pulmonary/Chest: Effort normal and breath sounds without rales or wheezing.                Abd:  Soft, NT, ND, + BS, no organomegaly               Neurological: Pt is alert. At baseline orientation, motor grossly intact               Skin: Skin is warm. No rashes, no other new lesions, LE edema - none               Psychiatric: Pt behavior is normal  without agitation   Micro: none  Cardiac tracings I have personally interpreted today:  none  Pertinent Radiological findings (summarize): none   Lab Results  Component Value Date   WBC 6.5 06/13/2022   HGB 12.9 06/13/2022   HCT 38.6 06/13/2022   PLT 309.0 06/13/2022   GLUCOSE 102 (H) 06/13/2022   CHOL 175 06/13/2022   TRIG 84.0 06/13/2022   HDL 64.50 06/13/2022   LDLDIRECT 159.0 10/21/2012   LDLCALC 94 06/13/2022   ALT 18 06/13/2022   AST 21 06/13/2022   NA 141 06/13/2022   K 4.3 06/13/2022   CL 103 06/13/2022   CREATININE 0.66 06/13/2022   BUN 15 06/13/2022   CO2 29 06/13/2022   TSH 0.89 06/13/2022   HGBA1C 6.0 06/13/2022   Assessment/Plan:  Lindsey Boyd is a 57 y.o. Asian [4] female with  has a past medical history of Allergy, seasonal allergies, Hyperlipidemia, and Hyperlipidemia.  Encounter for well adult exam with abnormal findings Age and sex appropriate education and counseling updated with regular exercise and diet Referrals for preventative services - pt states will for own Gyn appt and pap/mammogram soon Immunizations addressed - for shingrix at pharmacy, declines covid booster Smoking counseling  - none needed Evidence for depression or other mood disorder - none significant Most recent labs reviewed. I have personally reviewed and have noted: 1) the patient's medical and social history 2) The patient's current medications and supplements 3) The patient's height, weight, and BMI have been recorded in the chart   Hyperlipidemia Lab Results  Component Value Date   LDLCALC 94 06/13/2022   Stable, pt to continue current statin crestor 20 mg qd   Hyperglycemia Lab Results  Component Value Date   HGBA1C 6.0 06/13/2022   Stable, pt to continue current medical treatment  - diet, wt control   Vitamin D deficiency Last vitamin D Lab Results  Component  Value Date   VD25OH 52.60 06/13/2022   Stable, cont oral replacement   Travel advice  encounter Also hep A Ab status today with labs, if positive will not need Hep A immunization  Followup: Return in about 1 year (around 06/13/2023).  Oliver Barre, MD 06/16/2022 8:58 PM [facility] Internal Medicine

## 2022-06-13 NOTE — Patient Instructions (Addendum)
Please have your Shingrix (shingles) shots done at your local pharmacy.  Please continue all other medications as before, and refills have been done if requested.  Please have the pharmacy call with any other refills you may need.  Please continue your efforts at being more active, low cholesterol diet, and weight control.  You are otherwise up to date with prevention measures today.  Please keep your appointments with your specialists as you may have planned  Please go to the LAB at the blood drawing area for the tests to be done  You will be contacted by phone if any changes need to be made immediately.  Otherwise, you will receive a letter about your results with an explanation, but please check with MyChart first.  Please remember to sign up for MyChart if you have not done so, as this will be important to you in the future with finding out test results, communicating by private email, and scheduling acute appointments online when needed.  Please make an Appointment to return for your 1 year visit, or sooner if needed 

## 2022-06-14 ENCOUNTER — Other Ambulatory Visit: Payer: Self-pay

## 2022-06-14 LAB — HEPATITIS A ANTIBODY, TOTAL: Hepatitis A AB,Total: REACTIVE — AB

## 2022-06-15 ENCOUNTER — Other Ambulatory Visit (HOSPITAL_COMMUNITY): Payer: Self-pay

## 2022-06-15 ENCOUNTER — Encounter (HOSPITAL_COMMUNITY): Payer: Self-pay

## 2022-06-16 ENCOUNTER — Encounter: Payer: Self-pay | Admitting: Internal Medicine

## 2022-06-16 DIAGNOSIS — Z7184 Encounter for health counseling related to travel: Secondary | ICD-10-CM | POA: Insufficient documentation

## 2022-06-16 NOTE — Assessment & Plan Note (Signed)
Also hep A Ab status today with labs, if positive will not need Hep A immunization

## 2022-06-16 NOTE — Assessment & Plan Note (Signed)
Lab Results  Component Value Date   HGBA1C 6.0 06/13/2022   Stable, pt to continue current medical treatment  - diet, wt control

## 2022-06-16 NOTE — Assessment & Plan Note (Signed)
Last vitamin D Lab Results  Component Value Date   VD25OH 52.60 06/13/2022   Stable, cont oral replacement

## 2022-06-16 NOTE — Assessment & Plan Note (Signed)
Lab Results  Component Value Date   LDLCALC 94 06/13/2022   Stable, pt to continue current statin crestor 20 mg qd

## 2022-06-16 NOTE — Assessment & Plan Note (Signed)
Age and sex appropriate education and counseling updated with regular exercise and diet Referrals for preventative services - pt states will for own Gyn appt and pap/mammogram soon Immunizations addressed - for shingrix at pharmacy, declines covid booster Smoking counseling  - none needed Evidence for depression or other mood disorder - none significant Most recent labs reviewed. I have personally reviewed and have noted: 1) the patient's medical and social history 2) The patient's current medications and supplements 3) The patient's height, weight, and BMI have been recorded in the chart

## 2022-06-19 ENCOUNTER — Other Ambulatory Visit: Payer: Self-pay

## 2022-08-03 ENCOUNTER — Encounter: Payer: Self-pay | Admitting: Gastroenterology

## 2022-08-22 ENCOUNTER — Other Ambulatory Visit (HOSPITAL_BASED_OUTPATIENT_CLINIC_OR_DEPARTMENT_OTHER): Payer: Self-pay

## 2022-10-03 ENCOUNTER — Other Ambulatory Visit (HOSPITAL_BASED_OUTPATIENT_CLINIC_OR_DEPARTMENT_OTHER): Payer: Self-pay

## 2022-10-03 DIAGNOSIS — L2089 Other atopic dermatitis: Secondary | ICD-10-CM | POA: Diagnosis not present

## 2022-10-03 MED ORDER — PREDNISONE 10 MG PO TABS
ORAL_TABLET | ORAL | 0 refills | Status: AC
  Filled 2022-10-03: qty 70, 28d supply, fill #0

## 2022-10-24 ENCOUNTER — Other Ambulatory Visit (HOSPITAL_BASED_OUTPATIENT_CLINIC_OR_DEPARTMENT_OTHER): Payer: Self-pay

## 2022-10-24 DIAGNOSIS — L218 Other seborrheic dermatitis: Secondary | ICD-10-CM | POA: Diagnosis not present

## 2022-10-24 MED ORDER — HYDROCORTISONE 2.5 % EX CREA
1.0000 | TOPICAL_CREAM | Freq: Two times a day (BID) | CUTANEOUS | 1 refills | Status: AC | PRN
  Filled 2022-10-24: qty 30, 30d supply, fill #0
  Filled 2023-05-12: qty 30, 30d supply, fill #1

## 2022-11-19 ENCOUNTER — Ambulatory Visit: Payer: Commercial Managed Care - PPO | Admitting: Internal Medicine

## 2022-11-19 ENCOUNTER — Other Ambulatory Visit: Payer: Self-pay

## 2022-11-19 ENCOUNTER — Encounter: Payer: Self-pay | Admitting: Internal Medicine

## 2022-11-19 ENCOUNTER — Other Ambulatory Visit (HOSPITAL_COMMUNITY): Payer: Self-pay

## 2022-11-19 VITALS — BP 122/76 | HR 77 | Temp 99.1°F | Ht 59.75 in | Wt 112.0 lb

## 2022-11-19 DIAGNOSIS — R21 Rash and other nonspecific skin eruption: Secondary | ICD-10-CM

## 2022-11-19 DIAGNOSIS — R051 Acute cough: Secondary | ICD-10-CM

## 2022-11-19 DIAGNOSIS — R739 Hyperglycemia, unspecified: Secondary | ICD-10-CM | POA: Diagnosis not present

## 2022-11-19 DIAGNOSIS — E559 Vitamin D deficiency, unspecified: Secondary | ICD-10-CM | POA: Diagnosis not present

## 2022-11-19 LAB — POCT INFLUENZA A/B
Influenza A, POC: NEGATIVE
Influenza B, POC: NEGATIVE

## 2022-11-19 LAB — POC COVID19 BINAXNOW: SARS Coronavirus 2 Ag: NEGATIVE

## 2022-11-19 MED ORDER — HYDROCODONE BIT-HOMATROP MBR 5-1.5 MG/5ML PO SOLN
5.0000 mL | Freq: Four times a day (QID) | ORAL | 0 refills | Status: AC | PRN
  Filled 2022-11-19 (×2): qty 180, 9d supply, fill #0

## 2022-11-19 MED ORDER — DOXYCYCLINE HYCLATE 100 MG PO TABS
100.0000 mg | ORAL_TABLET | Freq: Two times a day (BID) | ORAL | 0 refills | Status: AC
  Filled 2022-11-19 (×2): qty 20, 10d supply, fill #0

## 2022-11-19 NOTE — Progress Notes (Unsigned)
Patient ID: Lindsey Boyd, female   DOB: [birth year], 57 y.o.   MRN: [id number]        Chief Complaint: follow up prod cough, malar rash, hyperglycemia, low vit d       HPI:  Lindsey Boyd is a 57 y.o. female Here with acute onset mild to mod 2-3 days ST, HA, general weakness and malaise, with prod cough greenish sputum, but Pt denies chest pain, increased sob or doe, wheezing, orthopnea, PND, increased LE swelling, palpitations, dizziness or syncope.  Also has unusual 2 mo onset malar area rash to face without itch or pain or swelling;  Pt denies polydipsia, polyuria, or new focal neuro s/s.   Has seen dermatology but topical steroid cream no help, but course of prednisone helped temporarily.         Wt Readings from Last 3 Encounters:  11/19/22 112 lb (50.8 kg)  06/13/22 114 lb 4 oz (51.8 kg)  06/08/21 112 lb 6.4 oz (51 kg)   BP Readings from Last 3 Encounters:  11/19/22 122/76  06/13/22 94/60  06/08/21 (!) 108/56         Past Medical History:  Diagnosis Date   Allergy    Hx of seasonal allergies    Hyperlipidemia    Hyperlipidemia    Past Surgical History:  Procedure Laterality Date   None      reports that she has never smoked. She has never used smokeless tobacco. She reports that she does not drink alcohol and does not use drugs. family history includes Breast cancer in her maternal grandmother; Cancer in her maternal grandfather and maternal grandmother; Cataracts in her brother, brother, and brother; Diabetes in her mother; Healthy in her father and sister; Hyperlipidemia in her mother; Hypertension in her mother; Hyperthyroidism in her mother; Hypothyroidism in her sister; Osteoporosis in her mother. No Known Allergies Current Outpatient Medications on File Prior to Visit  Medication Sig Dispense Refill   Cholecalciferol (VITAMIN D3) 50 MCG (2000 UT) TABS Take by mouth.     clobetasol cream (TEMOVATE) 0.05 % Apply 1 application. topically 2 (two) times a week. 30 g 0   fluticasone  (FLONASE) 50 MCG/ACT nasal spray Place 2 sprays into both nostrils daily. 16 g 11   hydrocortisone 2.5 % cream Apply 1 Application topically 2 (two) times daily as needed to rash on face 30 g 1   mometasone (NASONEX) 50 MCG/ACT nasal spray PLACE 2 SPRAYS IN EACH NOSTRIL ONCE A DAY 17 g 11   olopatadine (PATANOL) 0.1 % ophthalmic solution Place 1 drop into both eyes 2 (two) times daily. 5 mL 12   rosuvastatin (CRESTOR) 20 MG tablet Take 1 tablet (20 mg total) by mouth daily. 90 tablet 3   triamcinolone (NASACORT) 55 MCG/ACT AERO nasal inhaler Place 2 sprays into the nose daily. 50.7 mL 3   Vitamin D, Ergocalciferol, (DRISDOL) 1.25 MG (50000 UNIT) CAPS capsule Take 1 capsule (50,000 Units total) by mouth every 7 (seven) days. 12 capsule 0   No current facility-administered medications on file prior to visit.        ROS:  All others reviewed and negative.  Objective        PE:  BP 122/76 (BP Location: Right Arm, Patient Position: Sitting, Cuff Size: Normal)   Pulse 77   Temp 99.1 F (37.3 C) (Oral)   Ht 4' 11.75" (1.518 m)   Wt 112 lb (50.8 kg)   LMP 03/30/2011   SpO2 99%  BMI 22.06 kg/m                 Constitutional: Pt appears in NAD               HENT: Head: NCAT.                Right Ear: External ear normal.                 Left Ear: External ear normal.                Eyes: . Pupils are equal, round, and reactive to light. Conjunctivae and EOM are normal               Nose: without d/c or deformity               Neck: Neck supple. Gross normal ROM               Cardiovascular: Normal rate and regular rhythm.                 Pulmonary/Chest: Effort normal and breath sounds without rales or wheezing.                Abd:  Soft, NT, ND, + BS, no organomegaly               Neurological: Pt is alert. At baseline orientation, motor grossly intact               Skin: Skin is warm. No rashes, no other new lesions, LE edema - none               Psychiatric: Pt behavior is normal  without agitation   Micro: none  Cardiac tracings I have personally interpreted today:  none  Pertinent Radiological findings (summarize): none   Lab Results  Component Value Date   WBC 6.5 06/13/2022   HGB 12.9 06/13/2022   HCT 38.6 06/13/2022   PLT 309.0 06/13/2022   GLUCOSE 102 (H) 06/13/2022   CHOL 175 06/13/2022   TRIG 84.0 06/13/2022   HDL 64.50 06/13/2022   LDLDIRECT 159.0 10/21/2012   LDLCALC 94 06/13/2022   ALT 18 06/13/2022   AST 21 06/13/2022   NA 141 06/13/2022   K 4.3 06/13/2022   CL 103 06/13/2022   CREATININE 0.66 06/13/2022   BUN 15 06/13/2022   CO2 29 06/13/2022   TSH 0.89 06/13/2022   HGBA1C 6.0 06/13/2022   POCT - COVID - neg, Flu  - neg  Assessment/Plan:  Lindsey Boyd is a 57 y.o. Asian [4] female with  has a past medical history of Allergy, seasonal allergies, Hyperlipidemia, and Hyperlipidemia.  Vitamin D deficiency Last vitamin D Lab Results  Component Value Date   VD25OH 52.60 06/13/2022   Stable, cont oral replacement   Hyperglycemia Lab Results  Component Value Date   HGBA1C 6.0 06/13/2022   Stable, pt to continue current medical treatment  - diet, wt control   Malar rash Unusual rash - for rheum referral,  to f/u any worsening symptoms or concerns   Acute cough Mild to mod, for antibx course doxycycline 100 bid, cough med prn,  to f/u any worsening symptoms or concerns  Followup: Return if symptoms worsen or fail to improve.  Oliver Barre, MD 11/21/2022 8:14 PM [facility]

## 2022-11-19 NOTE — Patient Instructions (Addendum)
Your covid testing is negative today  Please take all new medication as prescribed - the antibiotic, and cough medicine  Please continue all other medications as before, and refills have been done if requested.  Please have the pharmacy call with any other refills you may need.  Please keep your appointments with your specialists as you may have planned  You will be contacted regarding the referral for: Rheumatology for Malar rash  Please let me know if unable to see rheum, for referral to dermatology

## 2022-11-21 ENCOUNTER — Encounter: Payer: Self-pay | Admitting: Internal Medicine

## 2022-11-21 DIAGNOSIS — R051 Acute cough: Secondary | ICD-10-CM | POA: Insufficient documentation

## 2022-11-21 DIAGNOSIS — R21 Rash and other nonspecific skin eruption: Secondary | ICD-10-CM | POA: Insufficient documentation

## 2022-11-21 NOTE — Assessment & Plan Note (Signed)
Mild to mod, for antibx course - doxycycline 100 bid, cough med prn, to f/u any worsening symptoms or concerns

## 2022-11-21 NOTE — Assessment & Plan Note (Signed)
Unusual rash - for rheum referral,  to f/u any worsening symptoms or concerns

## 2022-11-21 NOTE — Assessment & Plan Note (Signed)
Last vitamin D Lab Results  Component Value Date   VD25OH 52.60 06/13/2022   Stable, cont oral replacement

## 2022-11-21 NOTE — Assessment & Plan Note (Signed)
Lab Results  Component Value Date   HGBA1C 6.0 06/13/2022   Stable, pt to continue current medical treatment  - diet, wt control  

## 2022-12-31 DIAGNOSIS — H524 Presbyopia: Secondary | ICD-10-CM | POA: Diagnosis not present

## 2023-01-07 DIAGNOSIS — R3121 Asymptomatic microscopic hematuria: Secondary | ICD-10-CM | POA: Diagnosis not present

## 2023-03-21 ENCOUNTER — Ambulatory Visit: Payer: Commercial Managed Care - PPO | Admitting: Dermatology

## 2023-03-27 ENCOUNTER — Other Ambulatory Visit (HOSPITAL_COMMUNITY): Payer: Self-pay

## 2023-03-29 ENCOUNTER — Other Ambulatory Visit (HOSPITAL_BASED_OUTPATIENT_CLINIC_OR_DEPARTMENT_OTHER): Payer: Self-pay

## 2023-04-01 ENCOUNTER — Other Ambulatory Visit (HOSPITAL_BASED_OUTPATIENT_CLINIC_OR_DEPARTMENT_OTHER): Payer: Self-pay

## 2023-04-01 ENCOUNTER — Other Ambulatory Visit (HOSPITAL_COMMUNITY): Payer: Self-pay

## 2023-04-30 NOTE — Progress Notes (Unsigned)
 Office Visit Note  Patient: Lindsey Boyd             Date of Birth: [birth year]           MRN: [id number]             PCP: Corwin Levins, MD Referring: Corwin Levins, MD Visit Date: 05/01/2023 Occupation: Systems analyst  Subjective:  Follow-up (Patient states a while ago she has rashes on her face and patient states the rash has died down. Patient states she also has red blood cells in her urine. Patient states the left side of her face twitches as well. Patient states once in a while if she is out in the sun her eyes would hurt and be in pain. )   Discussed the use of AI scribe software for clinical note transcription with the patient, who gave verbal consent to proceed.  History of Present Illness   Lindsey Boyd is a 58 year old female who presents with skin rashes and facial twitching. She was referred by her primary doctor for evaluation of possible autoimmune disease. She previously saw Dr. Imogene Burn with dermatology for this problem last year.  She has been experiencing skin rashes for several months, primarily located on her face and behind her ears. The rashes have been intermittent, with one on her forehead persisting for approximately six months. Topical hydrocortisone provided minimal relief, whereas a three-month course of prednisone was effective in reducing the rash.  Facial twitching on the left side is a new symptom that has emerged alongside the rashes. No recent illness, swelling, or lumps in the neck or jaw, and no mouth or nose ulcers are present. Occasional blisters on the lips are mildly painful and respond to topical treatment.  She has a history of red blood cells in her urine for several years, with a prior kidney scan by a urologist showing no abnormalities. The cause of hematuria remains undetermined. Kidney function has been normal and most recent urinalyses were negative for protein.  In terms of family history, she mentions a first cousin with lupus, but no immediate  family members with autoimmune conditions.   No significant sun sensitivity or joint pain is reported, and there are no circulation issues such as fingers or toes turning red, blue, or white. She has no history of blood clots.   Activities of Daily Living:  Patient reports morning stiffness for 0 minute.   Patient Denies nocturnal pain.  Difficulty dressing/grooming: Denies Difficulty climbing stairs: Denies Difficulty getting out of chair: Denies Difficulty using hands for taps, buttons, cutlery, and/or writing: Denies  Review of Systems  Constitutional:  Negative for fatigue.  HENT:  Positive for mouth dryness. Negative for mouth sores.   Eyes:  Positive for dryness.  Respiratory:  Negative for shortness of breath.   Cardiovascular:  Negative for chest pain and palpitations.  Gastrointestinal:  Negative for blood in stool, constipation and diarrhea.  Endocrine: Negative for increased urination.  Genitourinary:  Negative for involuntary urination.  Musculoskeletal:  Negative for joint pain, gait problem, joint pain, joint swelling, myalgias, muscle weakness, morning stiffness, muscle tenderness and myalgias.  Skin:  Positive for rash. Negative for color change, hair loss and sensitivity to sunlight.  Allergic/Immunologic: Negative for susceptible to infections.  Neurological:  Negative for dizziness and headaches.  Hematological:  Negative for swollen glands.  Psychiatric/Behavioral:  Negative for depressed mood and sleep disturbance. The patient is nervous/anxious.     PMFS History:  Patient  Active Problem List   Diagnosis Date Noted   Microscopic hematuria 05/01/2023   Acute cough 11/21/2022   Malar rash 11/21/2022   Travel advice encounter 06/16/2022   Palpitations 06/08/2021   Snoring 06/07/2020   Vitamin D deficiency 06/07/2020   Left facial pain 06/07/2020   Encounter for well adult exam with abnormal findings 06/07/2020   Lichen sclerosus 04/08/2019   Vaginal  irritation 04/08/2019   Dysuria 04/08/2019   Hyperglycemia 02/27/2019   Itching 10/23/2016   Facial twitching 02/21/2016   Eustachian tube dysfunction 10/22/2015   Eye drainage 10/22/2015   Pulsatile tinnitus of left ear 02/24/2014   Occipital headache 02/24/2014   Well woman exam with routine gynecological exam 01/06/2013   Hyperlipidemia 07/15/2008   Tinnitus 07/15/2008   ALLERGIC RHINITIS, SEASONAL 07/15/2008   Tuberculosis, treated 07/15/2008    Past Medical History:  Diagnosis Date   Allergy    Hx of seasonal allergies    Hyperlipidemia    Hyperlipidemia     Family History  Problem Relation Age of Onset   Diabetes Mother    Hyperlipidemia Mother    Hypertension Mother    Osteoporosis Mother    Hyperthyroidism Mother    Healthy Father    Hypothyroidism Sister    Cataracts Brother    Healthy Sister    Cataracts Brother    Cataracts Brother    Cancer Maternal Grandmother        breast   Breast cancer Maternal Grandmother    Cancer Maternal Grandfather        liver   Colon cancer Neg Hx    Rectal cancer Neg Hx    Stomach cancer Neg Hx    Esophageal cancer Neg Hx    Liver cancer Neg Hx    Past Surgical History:  Procedure Laterality Date   None     Social History   Social History Narrative   [employer]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 Married '96. 2 sons '00, '05. Work [employer]. No history of abuse. SO- [employer] Marriage in good health. Discussed ACP referred to [email or web address]   Immunization History  Administered Date(s) Administered   Influenza,inj,Quad PF,6+ Mos 10/19/2015, 11/08/2022   Influenza-Unspecified 10/30/2018, 11/10/2020   PFIZER(Purple Top)SARS-COV-2 Vaccination 02/17/2019, 03/10/2019, 01/01/2020   Pfizer Covid-19 Vaccine Bivalent Booster 60yrs & up 04/12/2021   Tdap 04/10/2011, 04/12/2021     Objective: Vital Signs: BP 103/70 (BP Location: Right Arm, Patient  Position: Sitting, Cuff Size: Normal)   Pulse 70   Resp 14   Ht 4' 11.5" (1.511 m)   Wt 113 lb (51.3 kg)   LMP 03/30/2011   BMI 22.44 kg/m    Physical Exam HENT:     Mouth/Throat:     Mouth: Mucous membranes are moist.     Pharynx: Oropharynx is clear.     Comments: Right TMJ popping with full ROM, nontender Eyes:     Conjunctiva/sclera: Conjunctivae normal.  Cardiovascular:     Rate and Rhythm: Normal rate and regular rhythm.  Pulmonary:     Effort: Pulmonary effort is normal.     Breath sounds: Normal breath sounds.  Musculoskeletal:     Right lower leg: No edema.     Left lower leg: No edema.  Lymphadenopathy:     Cervical: No cervical adenopathy.  Skin:    General: Skin is warm and dry.     Findings: No rash.     Comments: Few telangiectasias on face  Nails with longitudinal ridges both hands Normal appearing nailfold capillaroscopy  Neurological:     Mental Status: She is alert.  Psychiatric:        Mood and Affect: Mood normal.     Musculoskeletal Exam:  Shoulders full ROM no tenderness or swelling Elbows full ROM no tenderness or swelling Wrists full ROM no tenderness or swelling Fingers full ROM no tenderness or swelling Knees full ROM no tenderness or swelling Ankles full ROM no tenderness or swelling   Investigation: No additional findings.  Imaging: No results found.  Recent Labs: Lab Results  Component Value Date   WBC 6.5 06/13/2022   HGB 12.9 06/13/2022   PLT 309.0 06/13/2022   NA 141 06/13/2022   K 4.3 06/13/2022   CL 103 06/13/2022   CO2 29 06/13/2022   GLUCOSE 102 (H) 06/13/2022   BUN 15 06/13/2022   CREATININE 0.66 06/13/2022   BILITOT 0.5 06/13/2022   ALKPHOS 65 06/13/2022   AST 21 06/13/2022   ALT 18 06/13/2022   PROT 8.0 06/13/2022   ALBUMIN 4.6 06/13/2022   CALCIUM 9.8 06/13/2022    Speciality Comments: No specialty comments available.  Procedures:  No procedures performed Allergies: Bee pollen   Assessment / Plan:      Visit Diagnoses: Malar rash - Plan: Sedimentation rate, C-reactive protein, C3 and C4, ANA,IFA RA Diag Pnl w/rflx Tit/Patn, Rheumatoid factor Intermittent facial and postauricular rashes, facial twitching, and hematuria with family history of lupus. Differential includes lupus, Sjogren's syndrome, or rheumatoid arthritis. Symptoms inactive, may affect inflammation tests but not antibody tests. - Order blood tests for ANA and inflammation markers. - If antibody tests positive but inflammation markers negative, consider monitoring follow-up. - If inflammation markers positive, schedule follow-up for further investigation.  Nail ridging Fingernail ridges likely due to age-related changes in nail growth plate. - Consider biotin or collagen supplements.   Microscopic hematuria She has had adequate workup with urology negative so far, no evidence of proteinuria or abnormal renal function to suggest any inflammatory disease involvement.    Orders: Orders Placed This Encounter  Procedures   Sedimentation rate   C-reactive protein   C3 and C4   ANA,IFA RA Diag Pnl w/rflx Tit/Patn   Rheumatoid factor   No orders of the defined types were placed in this encounter.    Follow-Up Instructions: Return if symptoms worsen or fail to improve.   Fuller Plan, MD  Note - This record has been created using [employer].  Chart creation errors have been sought, but may not always  have been located. Such creation errors do not reflect on  the standard of medical care.

## 2023-05-01 ENCOUNTER — Encounter: Payer: Self-pay | Admitting: Internal Medicine

## 2023-05-01 ENCOUNTER — Ambulatory Visit: Payer: Commercial Managed Care - PPO | Attending: Internal Medicine | Admitting: Internal Medicine

## 2023-05-01 VITALS — BP 103/70 | HR 70 | Resp 14 | Ht 59.5 in | Wt 113.0 lb

## 2023-05-01 DIAGNOSIS — L609 Nail disorder, unspecified: Secondary | ICD-10-CM

## 2023-05-01 DIAGNOSIS — R3129 Other microscopic hematuria: Secondary | ICD-10-CM | POA: Diagnosis not present

## 2023-05-01 DIAGNOSIS — A159 Respiratory tuberculosis unspecified: Secondary | ICD-10-CM | POA: Diagnosis not present

## 2023-05-01 DIAGNOSIS — R21 Rash and other nonspecific skin eruption: Secondary | ICD-10-CM

## 2023-05-01 NOTE — Assessment & Plan Note (Signed)
 Previous treatment for latent TB when immigrating to [place] ~40 years ago- reports 1 year course in [place]

## 2023-05-03 LAB — ANTI-NUCLEAR AB-TITER (ANA TITER)
ANA TITER: 1:80 {titer} — ABNORMAL HIGH
ANA Titer 1: 1:80 {titer} — ABNORMAL HIGH

## 2023-05-03 LAB — SEDIMENTATION RATE: Sed Rate: 33 mm/h — ABNORMAL HIGH (ref 0–30)

## 2023-05-03 LAB — ANA,IFA RA DIAG PNL W/RFLX TIT/PATN
Anti Nuclear Antibody (ANA): POSITIVE — AB
Cyclic Citrullin Peptide Ab: <16 U
Rheumatoid fact SerPl-aCnc: <10 [IU]/mL (ref <14)

## 2023-05-03 LAB — C-REACTIVE PROTEIN: CRP: <3 mg/L (ref <8.0)

## 2023-05-03 LAB — C3 AND C4
C3 Complement: 150 mg/dL (ref 83–193)
C4 Complement: 39 mg/dL (ref 15–57)

## 2023-05-13 ENCOUNTER — Other Ambulatory Visit (HOSPITAL_BASED_OUTPATIENT_CLINIC_OR_DEPARTMENT_OTHER): Payer: Self-pay

## 2023-05-15 ENCOUNTER — Other Ambulatory Visit (HOSPITAL_BASED_OUTPATIENT_CLINIC_OR_DEPARTMENT_OTHER): Payer: Self-pay

## 2023-06-14 ENCOUNTER — Encounter: Payer: Commercial Managed Care - PPO | Admitting: Internal Medicine

## 2023-07-01 ENCOUNTER — Other Ambulatory Visit (HOSPITAL_BASED_OUTPATIENT_CLINIC_OR_DEPARTMENT_OTHER): Payer: Self-pay

## 2023-07-03 ENCOUNTER — Encounter: Payer: Self-pay | Admitting: Internal Medicine

## 2023-07-03 ENCOUNTER — Other Ambulatory Visit (HOSPITAL_BASED_OUTPATIENT_CLINIC_OR_DEPARTMENT_OTHER): Payer: Self-pay

## 2023-07-03 ENCOUNTER — Ambulatory Visit (INDEPENDENT_AMBULATORY_CARE_PROVIDER_SITE_OTHER): Admitting: Internal Medicine

## 2023-07-03 ENCOUNTER — Ambulatory Visit: Payer: Self-pay | Admitting: Internal Medicine

## 2023-07-03 VITALS — BP 120/68 | HR 62 | Temp 98.1°F | Ht 59.5 in | Wt 111.0 lb

## 2023-07-03 DIAGNOSIS — Z860100 Personal history of colon polyps, unspecified: Secondary | ICD-10-CM

## 2023-07-03 DIAGNOSIS — Z0001 Encounter for general adult medical examination with abnormal findings: Secondary | ICD-10-CM

## 2023-07-03 DIAGNOSIS — R519 Headache, unspecified: Secondary | ICD-10-CM | POA: Diagnosis not present

## 2023-07-03 DIAGNOSIS — E7800 Pure hypercholesterolemia, unspecified: Secondary | ICD-10-CM

## 2023-07-03 DIAGNOSIS — R739 Hyperglycemia, unspecified: Secondary | ICD-10-CM | POA: Diagnosis not present

## 2023-07-03 DIAGNOSIS — Z0000 Encounter for general adult medical examination without abnormal findings: Secondary | ICD-10-CM

## 2023-07-03 DIAGNOSIS — R768 Other specified abnormal immunological findings in serum: Secondary | ICD-10-CM | POA: Insufficient documentation

## 2023-07-03 DIAGNOSIS — E538 Deficiency of other specified B group vitamins: Secondary | ICD-10-CM

## 2023-07-03 DIAGNOSIS — E559 Vitamin D deficiency, unspecified: Secondary | ICD-10-CM

## 2023-07-03 LAB — CBC WITH DIFFERENTIAL/PLATELET
Basophils Absolute: 0 10*3/uL (ref 0.0–0.1)
Basophils Relative: 0.6 % (ref 0.0–3.0)
Eosinophils Absolute: 0.2 10*3/uL (ref 0.0–0.7)
Eosinophils Relative: 3.5 % (ref 0.0–5.0)
HCT: 40.2 % (ref 36.0–46.0)
Hemoglobin: 13.4 g/dL (ref 12.0–15.0)
Lymphocytes Relative: 29.1 % (ref 12.0–46.0)
Lymphs Abs: 1.9 10*3/uL (ref 0.7–4.0)
MCHC: 33.4 g/dL (ref 30.0–36.0)
MCV: 84.6 fl (ref 78.0–100.0)
Monocytes Absolute: 0.6 10*3/uL (ref 0.1–1.0)
Monocytes Relative: 9 % (ref 3.0–12.0)
Neutro Abs: 3.8 10*3/uL (ref 1.4–7.7)
Neutrophils Relative %: 57.8 % (ref 43.0–77.0)
Platelets: 317 10*3/uL (ref 150.0–400.0)
RBC: 4.76 Mil/uL (ref 3.87–5.11)
RDW: 14 % (ref 11.5–15.5)
WBC: 6.5 10*3/uL (ref 4.0–10.5)

## 2023-07-03 LAB — HEPATIC FUNCTION PANEL
ALT: 17 U/L (ref 0–35)
AST: 20 U/L (ref 0–37)
Albumin: 4.9 g/dL (ref 3.5–5.2)
Alkaline Phosphatase: 77 U/L (ref 39–117)
Bilirubin, Direct: 0.1 mg/dL (ref 0.0–0.3)
Total Bilirubin: 0.6 mg/dL (ref 0.2–1.2)
Total Protein: 8.4 g/dL — ABNORMAL HIGH (ref 6.0–8.3)

## 2023-07-03 LAB — LIPID PANEL
Cholesterol: 225 mg/dL — ABNORMAL HIGH (ref 0–200)
HDL: 68.5 mg/dL (ref >39.00)
LDL Cholesterol: 139 mg/dL — ABNORMAL HIGH (ref 0–99)
NonHDL: 156.14
Total CHOL/HDL Ratio: 3
Triglycerides: 84 mg/dL (ref 0.0–149.0)
VLDL: 16.8 mg/dL (ref 0.0–40.0)

## 2023-07-03 LAB — BASIC METABOLIC PANEL WITH GFR
BUN: 14 mg/dL (ref 6–23)
CO2: 29 meq/L (ref 19–32)
Calcium: 10.1 mg/dL (ref 8.4–10.5)
Chloride: 101 meq/L (ref 96–112)
Creatinine, Ser: 0.71 mg/dL (ref 0.40–1.20)
GFR: 93.93 mL/min (ref >60.00)
Glucose, Bld: 102 mg/dL — ABNORMAL HIGH (ref 70–99)
Potassium: 3.9 meq/L (ref 3.5–5.1)
Sodium: 137 meq/L (ref 135–145)

## 2023-07-03 LAB — URINALYSIS, ROUTINE W REFLEX MICROSCOPIC
Bilirubin Urine: NEGATIVE
Ketones, ur: NEGATIVE
Nitrite: NEGATIVE
Specific Gravity, Urine: 1.01 (ref 1.000–1.030)
Total Protein, Urine: NEGATIVE
Urine Glucose: NEGATIVE
Urobilinogen, UA: 0.2 (ref 0.0–1.0)
pH: 7 (ref 5.0–8.0)

## 2023-07-03 LAB — VITAMIN B12: Vitamin B-12: 425 pg/mL (ref 211–911)

## 2023-07-03 LAB — HEMOGLOBIN A1C: Hgb A1c MFr Bld: 5.9 % (ref 4.6–6.5)

## 2023-07-03 LAB — TSH: TSH: 2.06 u[IU]/mL (ref 0.35–5.50)

## 2023-07-03 LAB — VITAMIN D 25 HYDROXY (VIT D DEFICIENCY, FRACTURES): VITD: 41.37 ng/mL (ref 30.00–100.00)

## 2023-07-03 MED ORDER — OLOPATADINE HCL 0.1 % OP SOLN
1.0000 [drp] | Freq: Two times a day (BID) | OPHTHALMIC | 12 refills | Status: AC
  Filled 2023-07-03: qty 5, 25d supply, fill #0
  Filled 2023-10-08: qty 5, 25d supply, fill #1

## 2023-07-03 MED ORDER — ROSUVASTATIN CALCIUM 20 MG PO TABS
20.0000 mg | ORAL_TABLET | Freq: Every day | ORAL | 3 refills | Status: AC
  Filled 2023-07-03: qty 90, 90d supply, fill #0
  Filled 2023-10-08: qty 90, 90d supply, fill #1

## 2023-07-03 NOTE — Assessment & Plan Note (Signed)
 Lab Results  Component Value Date   LDLCALC 139 (H) 07/03/2023   Stable, pt to continue current statin crestor  20 every day with good compliance, and low chol diet

## 2023-07-03 NOTE — Assessment & Plan Note (Signed)
 Age and sex appropriate education and counseling updated with regular exercise and diet Referrals for preventative services - pt to call for mammogram, also for colonoscopy Immunizations addressed - for shingrix at pharmacy Smoking counseling  - none needed Evidence for depression or other mood disorder - none significant Most recent labs reviewed. I have personally reviewed and have noted: 1) the patient's medical and social history 2) The patient's current medications and supplements 3) The patient's height, weight, and BMI have been recorded in the chart

## 2023-07-03 NOTE — Assessment & Plan Note (Signed)
 Exam benign, likely benign possibly neuritic transient,  to f/u any worsening symptoms or concerns

## 2023-07-03 NOTE — Assessment & Plan Note (Signed)
 Last vitamin D  Lab Results  Component Value Date   VD25OH 41.37 07/03/2023   Stable, cont oral replacement

## 2023-07-03 NOTE — Patient Instructions (Signed)
 Please remember to call for your yearly mammogram  Please have your Shingrix (shingles) shots done at your local pharmacy.  Please continue all other medications as before, and refills have been done if requested.  Please have the pharmacy call with any other refills you may need.  Please continue your efforts at being more active, low cholesterol diet, and weight control.  You are otherwise up to date with prevention measures today.  Please keep your appointments with your specialists as you may have planned - rheumatology for the Positive ANA  You will be contacted regarding the referral for: colonoscopy  Please go to the LAB at the blood drawing area for the tests to be done  You will be contacted by phone if any changes need to be made immediately.  Otherwise, you will receive a letter about your results with an explanation, but please check with MyChart first.  Please make an Appointment to return for your 1 year visit, or sooner if needed

## 2023-07-03 NOTE — Assessment & Plan Note (Signed)
 Recent finding apr 2025 per rheum with lower 1:80 titer, significance unclear, pt for f/u rheum as planned

## 2023-07-03 NOTE — Assessment & Plan Note (Signed)
 Lab Results  Component Value Date   HGBA1C 5.9 07/03/2023   Stable, pt to continue current medical treatment  - diet, wt control

## 2023-07-03 NOTE — Progress Notes (Signed)
 Patient ID: Lindsey Boyd, female   DOB: Nov 12, 1965, 58 y.o.   MRN: [id number]         Chief Complaint:: wellness exam and hyperglycemia, hld, low vit d, left headache       HPI:  Lindsey Boyd is a 58 y.o. female here for wellness exam; pt to call for mammogram, due for colonoscopy, for shingrix at pharmacy, o/w up to date                        Also has mild inteirmittent tender soreness without swelling to left lateral head, no rash or hair loss, sore to touch for a few hours, then resolved.  Pt denies chest pain, increased sob or doe, wheezing, orthopnea, PND, increased LE swelling, palpitations, dizziness or syncope.   Pt denies polydipsia, polyuria, or new focal neuro s/s.    Pt denies fever, wt loss, night sweats, loss of appetite, or other constitutional symptoms     Wt Readings from Last 3 Encounters:  07/03/23 111 lb (50.3 kg)  05/01/23 113 lb (51.3 kg)  11/19/22 112 lb (50.8 kg)   BP Readings from Last 3 Encounters:  07/03/23 120/68  05/01/23 103/70  11/19/22 122/76   Immunization History  Administered Date(s) Administered   Influenza,inj,Quad PF,6+ Mos 10/19/2015, 11/08/2022   Influenza-Unspecified 10/30/2018, 11/10/2020   PFIZER(Purple Top)SARS-COV-2 Vaccination 02/17/2019, 03/10/2019, 01/01/2020   Pfizer Covid-19 Vaccine Bivalent Booster 29yrs & up 04/12/2021   Tdap 04/10/2011, 04/12/2021   Health Maintenance Due  Topic Date Due   Zoster Vaccines- Shingrix (1 of 2) Never done   MAMMOGRAM  05/27/2022   Colonoscopy  07/03/2022      Past Medical History:  Diagnosis Date   Allergy    Hx of seasonal allergies    Hyperlipidemia    Hyperlipidemia    Past Surgical History:  Procedure Laterality Date   None      reports that she has never smoked. She has never been exposed to tobacco smoke. She has never used smokeless tobacco. She reports that she does not drink alcohol and does not use drugs. family history includes Breast cancer in her maternal grandmother; Cancer in her  maternal grandfather and maternal grandmother; Cataracts in her brother, brother, and brother; Diabetes in her mother; Healthy in her father and sister; Hyperlipidemia in her mother; Hypertension in her mother; Hyperthyroidism in her mother; Hypothyroidism in her sister; Osteoporosis in her mother. Allergies  Allergen Reactions   Bee Pollen    Current Outpatient Medications on File Prior to Visit  Medication Sig Dispense Refill   Cholecalciferol (VITAMIN D3) 50 MCG (2000 UT) TABS Take by mouth.     clobetasol  cream (TEMOVATE ) 0.05 % Apply 1 application. topically 2 (two) times a week. (Patient not taking: Reported on 07/03/2023) 30 g 0   doxycycline  (VIBRA -TABS) 100 MG tablet Take 1 tablet (100 mg total) by mouth 2 (two) times daily. (Patient not taking: Reported on 07/03/2023) 20 tablet 0   fluticasone  (FLONASE ) 50 MCG/ACT nasal spray Place 2 sprays into both nostrils daily. (Patient not taking: Reported on 07/03/2023) 16 g 11   hydrocortisone  2.5 % cream Apply 1 Application topically 2 (two) times daily as needed to rash on face (Patient not taking: Reported on 07/03/2023) 30 g 1   mometasone  (NASONEX ) 50 MCG/ACT nasal spray PLACE 2 SPRAYS IN EACH NOSTRIL ONCE A DAY (Patient not taking: Reported on 07/03/2023) 17 g 11   triamcinolone  (NASACORT ) 55 MCG/ACT AERO nasal  inhaler Place 2 sprays into the nose daily. (Patient not taking: Reported on 07/03/2023) 50.7 mL 3   Vitamin D , Ergocalciferol , (DRISDOL ) 1.25 MG (50000 UNIT) CAPS capsule Take 1 capsule (50,000 Units total) by mouth every 7 (seven) days. (Patient not taking: Reported on 07/03/2023) 12 capsule 0   No current facility-administered medications on file prior to visit.        ROS:  All others reviewed and negative.  Objective        PE:  BP 120/68 (BP Location: Right Arm, Patient Position: Sitting, Cuff Size: Normal)   Pulse 62   Temp 98.1 F (36.7 C) (Oral)   Ht 4' 11.5" (1.511 m)   Wt 111 lb (50.3 kg)   LMP 03/30/2011   SpO2 98%   BMI  22.04 kg/m                 Constitutional: Pt appears in NAD               HENT: Head: NCAT.                Right Ear: External ear normal.                 Left Ear: External ear normal.                Eyes: . Pupils are equal, round, and reactive to light. Conjunctivae and EOM are normal               Nose: without d/c or deformity               Neck: Neck supple. Gross normal ROM               Cardiovascular: Normal rate and regular rhythm.                 Pulmonary/Chest: Effort normal and breath sounds without rales or wheezing.                Abd:  Soft, NT, ND, + BS, no organomegaly               Neurological: Pt is alert. At baseline orientation, motor grossly intact               Skin: Skin is warm. No rashes, no other new lesions, LE edema - none               Psychiatric: Pt behavior is normal without agitation   Micro: none  Cardiac tracings I have personally interpreted today:  none  Pertinent Radiological findings (summarize): none   Lab Results  Component Value Date   WBC 6.5 07/03/2023   HGB 13.4 07/03/2023   HCT 40.2 07/03/2023   PLT 317.0 07/03/2023   GLUCOSE 102 (H) 07/03/2023   CHOL 225 (H) 07/03/2023   TRIG 84.0 07/03/2023   HDL 68.50 07/03/2023   LDLDIRECT 159.0 10/21/2012   LDLCALC 139 (H) 07/03/2023   ALT 17 07/03/2023   AST 20 07/03/2023   NA 137 07/03/2023   K 3.9 07/03/2023   CL 101 07/03/2023   CREATININE 0.71 07/03/2023   BUN 14 07/03/2023   CO2 29 07/03/2023   TSH 2.06 07/03/2023   HGBA1C 5.9 07/03/2023   Assessment/Plan:  Lindsey Boyd is a 58 y.o. Asian [4] female with  has a past medical history of Allergy, seasonal allergies, Hyperlipidemia, and Hyperlipidemia.  Encounter for well adult exam with abnormal findings Age and sex  appropriate education and counseling updated with regular exercise and diet Referrals for preventative services - pt to call for mammogram, also for colonoscopy Immunizations addressed - for shingrix at  pharmacy Smoking counseling  - none needed Evidence for depression or other mood disorder - none significant Most recent labs reviewed. I have personally reviewed and have noted: 1) the patient's medical and social history 2) The patient's current medications and supplements 3) The patient's height, weight, and BMI have been recorded in the chart   Hyperglycemia Lab Results  Component Value Date   HGBA1C 5.9 07/03/2023   Stable, pt to continue current medical treatment  - diet, wt control   Hyperlipidemia Lab Results  Component Value Date   LDLCALC 139 (H) 07/03/2023   Stable, pt to continue current statin crestor  20 every day with good compliance, and low chol diet   Vitamin D  deficiency Last vitamin D  Lab Results  Component Value Date   VD25OH 41.37 07/03/2023   Stable, cont oral replacement   ANA positive Recent finding apr 2025 per rheum with lower 1:80 titer, significance unclear, pt for f/u rheum as planned  Left-sided headache Exam benign, likely benign possibly neuritic transient,  to f/u any worsening symptoms or concerns  Followup: Return in about 1 year (around 07/02/2024).  Rosalia Colonel, MD 07/03/2023 9:19 PM [facility]

## 2023-07-09 ENCOUNTER — Other Ambulatory Visit (HOSPITAL_BASED_OUTPATIENT_CLINIC_OR_DEPARTMENT_OTHER): Payer: Self-pay

## 2023-07-09 MED ORDER — SHINGRIX 50 MCG/0.5ML IM SUSR
0.5000 mL | Freq: Once | INTRAMUSCULAR | 1 refills | Status: AC
  Filled 2023-07-09: qty 0.5, 1d supply, fill #0
  Filled 2023-10-10: qty 0.5, 1d supply, fill #1

## 2023-09-09 ENCOUNTER — Other Ambulatory Visit (HOSPITAL_BASED_OUTPATIENT_CLINIC_OR_DEPARTMENT_OTHER): Payer: Self-pay

## 2023-10-08 ENCOUNTER — Other Ambulatory Visit (HOSPITAL_BASED_OUTPATIENT_CLINIC_OR_DEPARTMENT_OTHER): Payer: Self-pay

## 2023-10-10 ENCOUNTER — Other Ambulatory Visit (HOSPITAL_BASED_OUTPATIENT_CLINIC_OR_DEPARTMENT_OTHER): Payer: Self-pay

## 2023-10-31 ENCOUNTER — Other Ambulatory Visit (HOSPITAL_BASED_OUTPATIENT_CLINIC_OR_DEPARTMENT_OTHER): Payer: Self-pay

## 2023-12-16 ENCOUNTER — Ambulatory Visit: Attending: Internal Medicine | Admitting: Internal Medicine

## 2023-12-16 ENCOUNTER — Encounter: Payer: Self-pay | Admitting: Internal Medicine

## 2023-12-16 VITALS — BP 108/66 | HR 64 | Temp 97.1°F | Resp 16 | Ht 59.5 in | Wt 112.8 lb

## 2023-12-16 DIAGNOSIS — R3129 Other microscopic hematuria: Secondary | ICD-10-CM | POA: Diagnosis not present

## 2023-12-16 DIAGNOSIS — R21 Rash and other nonspecific skin eruption: Secondary | ICD-10-CM

## 2023-12-16 DIAGNOSIS — R7689 Other specified abnormal immunological findings in serum: Secondary | ICD-10-CM

## 2023-12-16 NOTE — Progress Notes (Signed)
 Office Visit Note  Patient: Lindsey Boyd             Date of Birth: July 30, 1965           MRN: [id number]             PCP: Norleen Lynwood ORN, MD Referring: Norleen Lynwood ORN, MD Visit Date: 12/16/2023   Subjective:  Medication Management (Rash on face came up a few days ago but not as bad today. )    Discussed the use of AI scribe software for clinical note transcription with the patient, who gave verbal consent to proceed.  History of Present Illness   Lindsey Boyd is a 58 year old female who presents with facial rash and swelling.  We are following up for evaluation earlier this year with rashes and workup showing positive ANA and mildly elevated sedimentation rate.  Symptoms have been improved since last winter and early spring with no major flareups until just in the past few weeks.  She experienced the onset of facial redness and swelling starting last Tuesday, which she noticed upon waking. The rash is localized to her face and left ear, with associated itchiness.  She notes dryness and itchiness on her left ear, particularly at the lobe and top. Similar symptoms occurred six months ago, subsided, and then reappeared a couple of months ago on her ear before resolving again. The current episode began a few days ago, affecting her entire face.  She has been applying Vaseline to the affected areas due to dryness and avoids other moisturizers fearing they might exacerbate the condition.  She recalls a cold sore developing two weeks ago, which was new for her, and appeared about a week before the facial rash.  No circulation problems in her fingers, such as turning blue or white, but she reports general itchiness and very dry skin, especially during this time of year.    Previous HPI 05/01/23 Lindsey Boyd is a 58 year old female who presents with skin rashes and facial twitching. She was referred by her primary doctor for evaluation of possible autoimmune disease. She previously saw Dr. Laurence with  dermatology for this problem last year.   She has been experiencing skin rashes for several months, primarily located on her face and behind her ears. The rashes have been intermittent, with one on her forehead persisting for approximately six months. Topical hydrocortisone  provided minimal relief, whereas a three-month course of prednisone  was effective in reducing the rash.   Facial twitching on the left side is a new symptom that has emerged alongside the rashes. No recent illness, swelling, or lumps in the neck or jaw, and no mouth or nose ulcers are present. Occasional blisters on the lips are mildly painful and respond to topical treatment.   She has a history of red blood cells in her urine for several years, with a prior kidney scan by a urologist showing no abnormalities. The cause of hematuria remains undetermined. Kidney function has been normal and most recent urinalyses were negative for protein.   In terms of family history, she mentions a first cousin with lupus, but no immediate family members with autoimmune conditions.    No significant sun sensitivity or joint pain is reported, and there are no circulation issues such as fingers or toes turning red, blue, or white. She has no history of blood clots.   Review of Systems  Constitutional:  Negative for fatigue.  HENT:  Negative for mouth sores  and mouth dryness.   Eyes:  Negative for dryness.  Respiratory:  Negative for shortness of breath.   Cardiovascular:  Negative for chest pain and palpitations.  Gastrointestinal:  Negative for blood in stool, constipation and diarrhea.  Endocrine: Negative for increased urination.  Genitourinary:  Negative for involuntary urination.  Musculoskeletal:  Negative for joint pain, gait problem, joint pain, joint swelling, myalgias, muscle weakness, morning stiffness, muscle tenderness and myalgias.  Skin:  Positive for rash. Negative for color change, hair loss and sensitivity to sunlight.   Allergic/Immunologic: Negative for susceptible to infections.  Neurological:  Negative for dizziness and headaches.  Hematological:  Negative for swollen glands.  Psychiatric/Behavioral:  Negative for depressed mood and sleep disturbance. The patient is not nervous/anxious.     PMFS History:  Patient Active Problem List   Diagnosis Date Noted   ANA positive 07/03/2023   Left-sided headache 07/03/2023   Microscopic hematuria 05/01/2023   Acute cough 11/21/2022   Malar rash 11/21/2022   Travel advice encounter 06/16/2022   Palpitations 06/08/2021   Snoring 06/07/2020   Vitamin D  deficiency 06/07/2020   Left facial pain 06/07/2020   Encounter for well adult exam with abnormal findings 06/07/2020   Lichen sclerosus 04/08/2019   Vaginal irritation 04/08/2019   Dysuria 04/08/2019   Hyperglycemia 02/27/2019   Itching 10/23/2016   Facial twitching 02/21/2016   Eustachian tube dysfunction 10/22/2015   Eye drainage 10/22/2015   Pulsatile tinnitus of left ear 02/24/2014   Occipital headache 02/24/2014   Well woman exam with routine gynecological exam 01/06/2013   Hyperlipidemia 07/15/2008   Tinnitus 07/15/2008   ALLERGIC RHINITIS, SEASONAL 07/15/2008   Tuberculosis, treated 07/15/2008    Past Medical History:  Diagnosis Date   Allergy    Hx of seasonal allergies    Hyperlipidemia    Hyperlipidemia     Family History  Problem Relation Age of Onset   Diabetes Mother    Hyperlipidemia Mother    Hypertension Mother    Osteoporosis Mother    Hyperthyroidism Mother    Healthy Father    Hypothyroidism Sister    Cataracts Brother    Healthy Sister    Cataracts Brother    Cataracts Brother    Cancer Maternal Grandmother        breast   Breast cancer Maternal Grandmother    Cancer Maternal Grandfather        liver   Colon cancer Neg Hx    Rectal cancer Neg Hx    Stomach cancer Neg Hx    Esophageal cancer Neg Hx    Liver cancer Neg Hx    Past Surgical History:   Procedure Laterality Date   None     Social History   Social History Narrative   [employer]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 Married '96. 2 sons '00, '05. Work [employer]. No history of abuse. SO- comptroller [employer] Marriage in good health. Discussed ACP referred to [email or web address]   Immunization History  Administered Date(s) Administered   Influenza,inj,Quad PF,6+ Mos 10/19/2015, 11/08/2022   Influenza-Unspecified 10/30/2018, 11/10/2020   PFIZER(Purple Top)SARS-COV-2 Vaccination 02/17/2019, 03/10/2019, 01/01/2020   Pfizer Covid-19 Vaccine Bivalent Booster 47yrs & up 04/12/2021   Tdap 04/10/2011, 04/12/2021   Zoster Recombinant(Shingrix ) 07/09/2023, 10/10/2023     Objective: Vital Signs: BP 108/66   Pulse 64   Temp (!) 97.1 F (36.2 C)   Resp 16   Ht 4' 11.5 (1.511 m)   Wt 112 lb 12.8  oz (51.2 kg)   LMP 03/30/2011   BMI 22.40 kg/m    Physical Exam HENT:     Nose: Nose normal.     Mouth/Throat:     Mouth: Mucous membranes are moist.     Pharynx: Oropharynx is clear.  Eyes:     Conjunctiva/sclera: Conjunctivae normal.  Cardiovascular:     Rate and Rhythm: Normal rate and regular rhythm.  Pulmonary:     Effort: Pulmonary effort is normal.     Breath sounds: Normal breath sounds.  Musculoskeletal:     Right lower leg: No edema.     Left lower leg: No edema.  Lymphadenopathy:     Cervical: No cervical adenopathy.  Skin:    General: Skin is warm and dry.     Findings: Rash present.     Comments: Central facial blanching erythema and swellling mostly limited to medial edge of cheeks Left corner of mouth with resolving/healing ulcer  Neurological:     Mental Status: She is alert.  Psychiatric:        Mood and Affect: Mood normal.      Musculoskeletal Exam:  Shoulders full ROM no tenderness or swelling Elbows full ROM no tenderness or swelling Wrists full ROM no tenderness or  swelling Fingers full ROM no tenderness or swelling Knees full ROM no tenderness or swelling   Investigation: No additional findings.  Imaging: No results found.  Recent Labs: Lab Results  Component Value Date   WBC 6.5 07/03/2023   HGB 13.4 07/03/2023   PLT 317.0 07/03/2023   NA 137 07/03/2023   K 3.9 07/03/2023   CL 101 07/03/2023   CO2 29 07/03/2023   GLUCOSE 102 (H) 07/03/2023   BUN 14 07/03/2023   CREATININE 0.71 07/03/2023   BILITOT 0.6 07/03/2023   ALKPHOS 77 07/03/2023   AST 20 07/03/2023   ALT 17 07/03/2023   PROT 8.4 (H) 07/03/2023   ALBUMIN 4.9 07/03/2023   CALCIUM  10.1 07/03/2023    Speciality Comments: No specialty comments available.  Procedures:  No procedures performed Allergies: Bee pollen   Assessment / Plan:     Visit Diagnoses: ANA positive - Plan: Anti-Smith antibody, RNP Antibody, Sjogrens syndrome-A extractable nuclear antibody, Sjogrens syndrome-B extractable nuclear antibody, Anti-DNA antibody, double-stranded, C3 and C4, Sedimentation rate Positive ANA and elevated sedimentation rate suggest autoimmune disorder. Further testing required. - Ordered blood tests for lupus and Sjogren's antibodies. - Consider hydroxychloroquine if confirmed.  Malar rash Facial and left ear dermatitis with swelling and pruritus Acute dermatitis with swelling and pruritus, differential includes eczema and autoimmune-related dermatitis. - Recommend dermatologist if tests normal. - Prescribe low-strength topical steroid for short-term use if tests normal.  Dry skin (xerosis) Chronic xerosis contributing to pruritus, possibly exacerbated by environmental factors. - Continue Vaseline for dryness.   Orders: Orders Placed This Encounter  Procedures   Anti-Smith antibody   RNP Antibody   Sjogrens syndrome-A extractable nuclear antibody   Sjogrens syndrome-B extractable nuclear antibody   Anti-DNA antibody, double-stranded   C3 and C4   Sedimentation rate    No orders of the defined types were placed in this encounter.    Follow-Up Instructions: Return if symptoms worsen or fail to improve.   Lonni LELON Ester, MD  Note - This record has been created using [employer].  Chart creation errors have been sought, but may not always  have been located. Such creation errors do not reflect on  the standard of medical care.

## 2023-12-17 ENCOUNTER — Ambulatory Visit: Payer: Self-pay | Admitting: Internal Medicine

## 2023-12-17 DIAGNOSIS — R21 Rash and other nonspecific skin eruption: Secondary | ICD-10-CM

## 2023-12-17 LAB — ANTI-SMITH ANTIBODY: ENA SM Ab Ser-aCnc: <1 AI

## 2023-12-17 LAB — ANTI-DNA ANTIBODY, DOUBLE-STRANDED: ds DNA Ab: <1 [IU]/mL

## 2023-12-17 LAB — SJOGRENS SYNDROME-A EXTRACTABLE NUCLEAR ANTIBODY: SSA (Ro) (ENA) Antibody, IgG: 2 AI — AB

## 2023-12-17 LAB — SJOGRENS SYNDROME-B EXTRACTABLE NUCLEAR ANTIBODY: SSB (La) (ENA) Antibody, IgG: <1 AI

## 2023-12-17 LAB — C3 AND C4
C3 Complement: 147 mg/dL (ref 83–193)
C4 Complement: 35 mg/dL (ref 15–57)

## 2023-12-17 LAB — SEDIMENTATION RATE: Sed Rate: 28 mm/h (ref 0–30)

## 2023-12-17 LAB — RNP ANTIBODY: Ribonucleic Protein(ENA) Antibody, IgG: <1 AI

## 2023-12-17 NOTE — Progress Notes (Signed)
 Results showed her previously high sed rate improved to normal which is good. She has a very low positive SSA antibody test. I believe this is an incidental finding and does not really mean she has an autoimmune disease from it. I recommend referring her to dermatology to evaluate her rashes if agreeable. In the meantime, she can try using a low potency topical steroid on affected areas such as hydrocortisone  1% (OTC strength) for up to 1 week at a time.

## 2024-01-01 DIAGNOSIS — H5213 Myopia, bilateral: Secondary | ICD-10-CM | POA: Diagnosis not present

## 2024-01-01 DIAGNOSIS — H524 Presbyopia: Secondary | ICD-10-CM | POA: Diagnosis not present

## 2024-02-03 ENCOUNTER — Telehealth: Payer: Self-pay | Admitting: Internal Medicine

## 2024-02-03 ENCOUNTER — Encounter: Payer: Self-pay | Admitting: *Deleted

## 2024-02-03 ENCOUNTER — Encounter: Payer: Self-pay | Admitting: Internal Medicine

## 2024-02-03 NOTE — Telephone Encounter (Signed)
 Patient is requesting her referral Dr. Jeannetta sent emailed or picked up due to her appointment being tomorrow for the dermatologist. Pt is requesting a call.

## 2024-02-03 NOTE — Telephone Encounter (Signed)
 Referral and notes faxed.     MyChart message sent to patient.

## 2024-02-04 NOTE — Telephone Encounter (Signed)
Referral emailed to patient

## 2024-02-04 NOTE — Telephone Encounter (Signed)
 Referral faxed and emailed, I called patient.
